# Patient Record
Sex: Female | Born: 2000 | Race: Black or African American | Hispanic: No | Marital: Single | Smoking: Never smoker
Health system: Southern US, Community
[De-identification: ages and names within clinical notes are randomized; demographics above are authoritative.]

## PROBLEM LIST (undated history)

## (undated) HISTORY — PX: TONSILLECTOMY: SUR1361

---

## 2019-01-30 DIAGNOSIS — Z3490 Encounter for supervision of normal pregnancy, unspecified, unspecified trimester: Secondary | ICD-10-CM | POA: Insufficient documentation

## 2019-01-31 ENCOUNTER — Other Ambulatory Visit (HOSPITAL_COMMUNITY)
Admission: RE | Admit: 2019-01-31 | Discharge: 2019-01-31 | Disposition: A | Discharge status: Home / Self Care | Payer: BC Managed Care – PPO | Source: Ambulatory Visit | Attending: Medical | Admitting: Medical

## 2019-01-31 ENCOUNTER — Other Ambulatory Visit: Payer: Self-pay

## 2019-01-31 ENCOUNTER — Ambulatory Visit (INDEPENDENT_AMBULATORY_CARE_PROVIDER_SITE_OTHER): Payer: BC Managed Care – PPO | Admitting: Medical

## 2019-01-31 ENCOUNTER — Other Ambulatory Visit: Payer: Self-pay | Admitting: Medical

## 2019-01-31 ENCOUNTER — Encounter: Payer: Self-pay | Admitting: Medical

## 2019-01-31 DIAGNOSIS — Z3481 Encounter for supervision of other normal pregnancy, first trimester: Secondary | ICD-10-CM | POA: Diagnosis not present

## 2019-01-31 DIAGNOSIS — Z113 Encounter for screening for infections with a predominantly sexual mode of transmission: Secondary | ICD-10-CM

## 2019-01-31 DIAGNOSIS — Z3482 Encounter for supervision of other normal pregnancy, second trimester: Secondary | ICD-10-CM | POA: Diagnosis not present

## 2019-01-31 DIAGNOSIS — Z3490 Encounter for supervision of normal pregnancy, unspecified, unspecified trimester: Secondary | ICD-10-CM | POA: Insufficient documentation

## 2019-01-31 MED ORDER — BLOOD PRESSURE KIT DEVI: 1.0000 | 0 refills | Status: DC

## 2019-01-31 NOTE — Progress Notes (Signed)
   PRENATAL VISIT NOTE  Subjective:  Danielle Levine is a 18 y.o. G1P0 at 76w4dbeing seen today for her first prenatal visit for this pregnancy. This pregnancy was unplanned. She is excited. She is currently monitored for the following issues for this low-risk pregnancy and has Supervision of normal pregnancy, antepartum on their problem list.  Patient reports no complaints.  Contractions: Not present. Vag. Bleeding: None.   . Denies leaking of fluid.   She is planning to breastfeed. Desires Depo for contraception.   The following portions of the patient's history were reviewed and updated as appropriate: allergies, current medications, past family history, past medical history, past social history, past surgical history and problem list.   Objective:   Vitals:   01/31/19 1033 01/31/19 1038  BP: 100/66   Pulse: 96   Temp: 98.1 F (36.7 C)   Weight: 115 lb (52.2 kg)   Height:  '5\' 3"'$  (1.6 m)    Fetal Status: Fetal Heart Rate (bpm): 154         General:  Alert, oriented and cooperative. Patient is in no acute distress.  Skin: Skin is warm and dry. No rash noted.   Cardiovascular: Normal heart rate and rhythm noted  Respiratory: Normal respiratory effort, no problems with respiration noted. Clear to auscultation.   Abdomen: Soft, gravid, appropriate for gestational age. Normal bowel sounds. Non-tender. Pain/Pressure: Absent     Pelvic: Cervical exam deferred        Extremities: Normal range of motion.  Edema: None  Mental Status: Normal mood and affect. Normal behavior. Normal judgment and thought content.   Assessment and Plan:  Pregnancy: G1P0 at 176w4d. Encounter for supervision of normal pregnancy, antepartum, unspecified gravidity - Blood Pressure Monitoring (BLOOD PRESSURE KIT) DEVI; 1 kit by Does not apply route once a week. Check BP regularly and record readings into the Bab[employer] App.  Large Cuff.  DX O90.0  Dispense: 1 each; Refill: 0 - Enroll Patient in [employer] -  Babyscripts Schedule Optimization - AFP, Serum, Open Spina Bifida - Obstetric Panel, Including HIV - Culture, OB Urine - Genetic Screening - [place] OB COMP + 14 WK; Future - Cervicovaginal ancillary only( [facility])  Preterm labor/ second trimester symptoms and general obstetric precautions including but not limited to vaginal bleeding, contractions, leaking of fluid and fetal movement were reviewed in detail with the patient. Please refer to After Visit Summary for other counseling recommendations.   Return in about 4 weeks (around 02/28/2019) for LOB, Virtual.  No future appointments.  JuKerry HoughPA-C

## 2019-01-31 NOTE — Addendum Note (Signed)
Addended by: Tamela Oddi on: 01/31/2019 11:40 AM   Modules accepted: Orders

## 2019-01-31 NOTE — Patient Instructions (Addendum)
Second Trimester of Pregnancy  The second trimester is from week 14 through week 27 (month 4 through 6). This is often the time in pregnancy that you feel your best. Often times, morning sickness has lessened or quit. You may have more energy, and you may get hungry more often. Your unborn baby is growing rapidly. At the end of the sixth month, he or she is about 9 inches long and weighs about 1 pounds. You will likely feel the baby move between 18 and 20 weeks of pregnancy. Follow these instructions at home: Medicines  Take over-the-counter and prescription medicines only as told by your doctor. Some medicines are safe and some medicines are not safe during pregnancy.  Take a prenatal vitamin that contains at least 600 micrograms (mcg) of folic acid.  If you have trouble pooping (constipation), take medicine that will make your stool soft (stool softener) if your doctor approves. Eating and drinking   Eat regular, healthy meals.  Avoid raw meat and uncooked cheese.  If you get low calcium from the food you eat, talk to your doctor about taking a daily calcium supplement.  Avoid foods that are high in fat and sugars, such as fried and sweet foods.  If you feel sick to your stomach (nauseous) or throw up (vomit): ? Eat 4 or 5 small meals a day instead of 3 large meals. ? Try eating a few soda crackers. ? Drink liquids between meals instead of during meals.  To prevent constipation: ? Eat foods that are high in fiber, like fresh fruits and vegetables, whole grains, and beans. ? Drink enough fluids to keep your pee (urine) clear or pale yellow. Activity  Exercise only as told by your doctor. Stop exercising if you start to have cramps.  Do not exercise if it is too hot, too humid, or if you are in a place of great height (high altitude).  Avoid heavy lifting.  Wear low-heeled shoes. Sit and stand up straight.  You can continue to have sex unless your doctor tells you not  to. Relieving pain and discomfort  Wear a good support bra if your breasts are tender.  Take warm water baths (sitz baths) to soothe pain or discomfort caused by hemorrhoids. Use hemorrhoid cream if your doctor approves.  Rest with your legs raised if you have leg cramps or low back pain.  If you develop puffy, bulging veins (varicose veins) in your legs: ? Wear support hose or compression stockings as told by your doctor. ? Raise (elevate) your feet for 15 minutes, 3-4 times a day. ? Limit salt in your food. Prenatal care  Write down your questions. Take them to your prenatal visits.  Keep all your prenatal visits as told by your doctor. This is important. Safety  Wear your seat belt when driving.  Make a list of emergency phone numbers, including numbers for family, friends, the hospital, and police and fire departments. General instructions  Ask your doctor about the right foods to eat or for help finding a counselor, if you need these services.  Ask your doctor about local prenatal classes. Begin classes before month 6 of your pregnancy.  Do not use hot tubs, steam rooms, or saunas.  Do not douche or use tampons or scented sanitary pads.  Do not cross your legs for long periods of time.  Visit your dentist if you have not done so. Use a soft toothbrush to brush your teeth. Floss gently.  Avoid all smoking, herbs,   and alcohol. Avoid drugs that are not approved by your doctor.  Do not use any products that contain nicotine or tobacco, such as cigarettes and e-cigarettes. If you need help quitting, ask your doctor.  Avoid cat litter boxes and soil used by cats. These carry germs that can cause birth defects in the baby and can cause a loss of your baby (miscarriage) or stillbirth. Contact a doctor if:  You have mild cramps or pressure in your lower belly.  You have pain when you pee (urinate).  You have bad smelling fluid coming from your vagina.  You continue to  feel sick to your stomach (nauseous), throw up (vomit), or have watery poop (diarrhea).  You have a nagging pain in your belly area.  You feel dizzy. Get help right away if:  You have a fever.  You are leaking fluid from your vagina.  You have spotting or bleeding from your vagina.  You have severe belly cramping or pain.  You lose or gain weight rapidly.  You have trouble catching your breath and have chest pain.  You notice sudden or extreme puffiness (swelling) of your face, hands, ankles, feet, or legs.  You have not felt the baby move in over an hour.  You have severe headaches that do not go away when you take medicine.  You have trouble seeing. Summary  The second trimester is from week 14 through week 27 (months 4 through 6). This is often the time in pregnancy that you feel your best.  To take care of yourself and your unborn baby, you will need to eat healthy meals, take medicines only if your doctor tells you to do so, and do activities that are safe for you and your baby.  Call your doctor if you get sick or if you notice anything unusual about your pregnancy. Also, call your doctor if you need help with the right food to eat, or if you want to know what activities are safe for you. This information is not intended to replace advice given to you by your health care provider. Make sure you discuss any questions you have with your health care provider. Document Released: 05/03/2009 Document Revised: 05/31/2018 Document Reviewed: 03/14/2016 Elsevier Patient Education  [place] Medications in Pregnancy   Acne:  Benzoyl Peroxide  Salicylic Acid   Backache/Headache:  Tylenol: 2 regular strength every 4 hours OR        2 Extra strength every 6 hours   Colds/Coughs/Allergies:  Benadryl (alcohol free) 25 mg every 6 hours as needed  Breath right strips  Claritin  Cepacol throat lozenges  Chloraseptic throat spray  Cold-Eeze- up to three  times per day  Cough drops, alcohol free  Flonase (by prescription only)  Guaifenesin  Mucinex  Robitussin DM (plain only, alcohol free)  Saline nasal spray/drops  Sudafed (pseudoephedrine) & Actifed * use only after [redacted] weeks gestation and if you do not have high blood pressure  Tylenol  Vicks Vaporub  Zinc lozenges  Zyrtec   Constipation:  Colace  Ducolax suppositories  Fleet enema  Glycerin suppositories  Metamucil  Milk of magnesia  Miralax  Senokot  Smooth move tea   Diarrhea:  Kaopectate  Imodium A-D   *NO pepto Bismol   Hemorrhoids:  Anusol  Anusol HC  Preparation H  Tucks   Indigestion:  Tums  Maalox  Mylanta  Zantac  Pepcid   Insomnia:  Benadryl (alcohol free) '25mg'$  every 6 hours as needed  Tylenol PM  Unisom, no Gelcaps   Leg Cramps:  Tums  MagGel   Nausea/Vomiting:  Bonine  Dramamine  Emetrol  Ginger extract  Sea bands  Meclizine  Nausea medication to take during pregnancy:  Unisom (doxylamine succinate 25 mg tablets) Take one tablet daily at bedtime. If symptoms are not adequately controlled, the dose can be increased to a maximum recommended dose of two tablets daily (1/2 tablet in the morning, 1/2 tablet mid-afternoon and one at bedtime).  Vitamin B6 '100mg'$  tablets. Take one tablet twice a day (up to 200 mg per day).   Skin Rashes:  Aveeno products  Benadryl cream or '25mg'$  every 6 hours as needed  Calamine Lotion  1% cortisone cream   Yeast infection:  Gyne-lotrimin 7  Monistat 7    **If taking multiple medications, please check labels to avoid duplicating the same active ingredients  **take medication as directed on the label  ** Do not exceed 4000 mg of tylenol in 24 hours  **Do not take medications that contain aspirin or ibuprofen         AREA PEDIATRIC/FAMILY PRACTICE PHYSICIANS  Central/[place]outheast [place] [phone]) . [facility] Davy Pique, MD; Gwendlyn Deutscher, MD; Walker Kehr, MD; Andria Frames, MD; McDiarmid,  MD; Dutch Quint, MD; Nori Riis, MD; Mingo Amber, [address] [place], [place] [address] o [phone] o Mon-Fri 8:30-12:30, 1:30-5:00 o Providers come to see babies at [facility] o Accepting Medicaid . [place] [facility] [place] providers who accept newborns: Dorthy Cooler, MD; Orland Mustard, MD; Stephanie Acre, MD o [place], [place], [place] [address] o [phone] o Mon-Fri 8:00-5:30 o Babies seen by providers at [facility] o Does NOT accept Medicaid o Please call early in hospitalization for appointment (limited availability)  . Mustard [place], MD o [address] [place], [place] [address] o [phone] o Mon, Tue, Thur, Fri 8:30-5:00, Wed 10:00-7:00 (closed 1-2pm) o Babies seen by [facility] providers o Accepting Medicaid . N[place], MD o [place], [place], [place] [address] o [phone] o Mon-Fri 8:30-5:00, Sat 8:30-12:00 o Provider comes to see babies at [place] Medicaid o Must have been referred from current patients or contacted office prior to delivery . [facility] ([place] for [place]) Franne Forts, MD; Tamera Punt, MD; Doneen Poisson, MD; Fatima Sanger, MD; Wynetta Emery, MD; Jess Barters, MD; Tami Ribas, MD; Herbert Moors, MD; Derrell Lolling, MD; Dorothyann Peng, MD; Lucious Groves, NP; Baldo Ash, NP o [address], [place], [place] [address] o [phone] o Mon, Tue, Thur, Fri 8:30-5:30, Wed 9:30-5:30, Sat 8:30-12:30 o Babies seen by [facility] providers o Accepting Medicaid o Only accepting infants of first-time parents or siblings of current patients [facility] discharge coordinator will make follow-up appointment . Baltazar Najjar o [place] [address], [place], [place]  [address] o [phone]   Fax - [phone] . [facility] o [address], [place], [place]  [address] o Phone - [phone]   Fax [phone] . [place], [place], [place], [place]  [address] o [phone]  East[place]ortheast [place] [phone]) . [facility] Reginal Lutes, MD; Jacklynn Ganong, MD; Torrie Mayers, MD; MD; Rosana Hoes, MD; Servando Salina, MD; Rose Fillers, MD; Rex Kras, MD; Corinna Capra, MD; Volney American, MD; Trilby Drummer, MD; Janann Colonel, MD; Jimmye Norman, [place], [place], [place] [address] o [phone] o Mon-Fri 8:30-5:00 (extended evenings Mon-Thur as needed), Sat-Sun 10:00-1:00 o Providers  come to see babies at [place] Medicaid for families of first-time babies and families with all children in the household age 48 and under. Must register with office prior to making appointment (M-F only). . [place], NP; Tomi Bamberger, MD; Redmond School, MD; [place], [address] [place], [place] [address] o [phone] o Mon-Fri 8:00-5:00 o Babies seen by providers at [facility] o Does NOT accept Medicaid/Commercial Insurance Only . [facility] [place] [facility])  Marnee Guarneri, MD; Drema Dallas, MD; Montine Circle, MD; Vilma Prader, MD; Vanita Panda, MD; Alfonso Ramus, MD; Ruthann Cancer, MD; Roxanne Mins, MD; Rosalva Ferron, MD; Polly Cobia, MD o [address] [place], [place] [address] o [phone] o Mon-Fri 8:30-5:30, Sat (Oct.-Mar.) 9:00-1:00 o Babies seen by providers at [place] [phone]) . [facility] Peers, MD; Suzan Slick, MD o [place] [address], [place], [place] [address] o [phone] o Mon-Fri 8:30-5:00, Sat 8:30-12:00 o Providers come to see babies at [facility] o Does NOT accept Medicaid . [place] at [place], [place]; [place], MD; [place], [place]; Nancy Fetter, MD; Moreen Fowler, [place], [place], [place] [address] o [phone] o Mon-Fri 8:00-5:00 o Babies seen by providers at [facility] o Does NOT accept Medicaid o Only accepting babies of parents who are  patients o Please call early in hospitalization for appointment (limited availability) . [facility] Blanca Friend, MD; Sharlene Motts, MD; Rod Can, MD; Warner Mccreedy, NP; Sabra Heck, MD; Ermalinda Memos, MD; Sharlett Iles, NP; Aurther Loft, MD; Jerrye Beavers, MD; Marcello Moores, MD; Berline Lopes, MD; Charolette Forward, MD o [place]. [place], [place], [place] [address] o [phone] o Mon-Fri 8:00-5:00, Sat 9:00-12:00 o Providers come to see babies at [facility] o Does NOT accept [facility] [phone]) . [place] providers accepting new patients: Dayna Ramus, NP; [place], [place], [place], [place] [address] o [phone] o Mon-Fri 8:00-5:00 o Babies seen by providers at [facility] o Does NOT accept Medicaid o Only accepting babies of parents who are patients o Please call early in hospitalization for appointment (limited availability) . [facility] Oswaldo Conroy, MD; Sheran Lawless, MD o [address] [place], [place] [address] o [phone] (press 1 to schedule appointment) o Mon-Fri 8:00-5:00 o Providers come to see babies at [facility] o Does NOT accept Medicaid . [facility] Jodi Mourning, MD o [address] [place], [place] [address] o [phone] o Mon-Fri 8:30-5:00 (lunch 12:30-1:00), extended hours by appointment only Wed 5:00-6:30 o Babies seen by [facility] providers o Accepting Medicaid . [place] at Evalyn Casco, MD; [place], MD; Ethlyn Gallery, MD o [place], [place], [place] [address] o [phone] o Mon-Fri 8:00-5:00 o Babies seen by [facility] providers o Does NOT accept Medicaid . Therapist, music at [place], MD; Yong Channel, MD; [place], [address] [place], [place] [address] o [phone] o Mon-Fri 8:00-5:00 o Babies seen by [facility] providers o Does NOT accept Medicaid . [place], [place]; [place], [place]; [place], NP; Albertina Parr, MD;  Frederic Jericho, MD; Ronney Lion, MD; Carlos Levering, NP; Jerelene Redden, NP; Tomasita Crumble, NP; Ronelle Nigh, NP; Corinna Lines, MD; [place], MD o [place]., [place], [place] [address] o [phone] o Mon-Fri 8:30-5:00, Sat 10:00-1:00 o Providers come to see babies at [facility] o Does NOT accept Medicaid o Free prenatal information session Tuesdays at 4:45pm . [place]  Porfirio Oar, MD; Araceli Bouche, [place]; [place], [place]; [place], [place]., [place] [address] o [phone] o Mon-Fri 7:30-5:30 o Babies seen by [facility] providers . [facility] o [address], [place], [place], [place]  [address] o [phone]   Fax - [phone]  [place] [phone] & [phone]) . [place], MD o [address] [place], [place] [address] o [phone] o Mon-Thur 8:00-6:00 o Providers come to see babies at [place] Medicaid . [place], NP; Melford Aase, MD; [place], [place]; [place], [place]., [place], [place] [address] o [phone] o Mon-Thur 7:30-7:30, Fri 7:30-4:30 o Babies seen by [facility] providers o Accepting Medicaid . [facility] Nyra Jabs, MD; Cristino Martes, NP; Gertie Baron, MD o [place] [place] [address], [place], [place] [address] o [phone] o Mon-Fri 8:30-5:00, Sat 8:30-12:00 o Providers come to see babies at [place] Medicaid o Must have "Meet & Greet" appointment at office prior to delivery . [place] ([place]) Jodene Nam, MD; Juleen China, MD; Clydene Laming, [place], [place], [place] [address] o [phone] o Mon-Wed 8:00-6:00, Thur-Fri 8:00-5:00, Sat 9:00-12:00 o Providers come to see babies at [facility] o Does NOT accept Medicaid o Only accepting siblings of current patients . [facility] [place], [place],  [place], [place]  [address] o [phone]   Fax [phone] . [place] at [place] [address], [place], [place]  [address] o [phone]   Fax - [place] [phone] & [phone]) . Therapist, music at [place], DO; [place], [address] [place], [place] [address] o [phone] o Mon-Fri 7:00-5:00 o Babies seen by [facility] providers o Does NOT accept Medicaid . [place], MD; [place], [place]; [place], [place], [place], [place] [address] o [phone] o Mon-Fri 8:00-5:00 o Babies seen by [facility] providers o Accepting Medicaid . [place], MD; [place], [place]; [place], NP; [place], [place], [place], [place] [address] o [phone] o Mon-Fri 8:00-5:00 o Babies seen by providers at [place] [phone]) . [facility] [place], [place] o [address]., [place], [place] [address] o [phone] o Mon-Fri 8:00-5:00 o Babies seen by [facility] providers o Does NOT accept Medicaid o Limited availability, please call early in hospitalization to schedule follow-up . [facility] Leilani Merl, PA; Maisie Fus, MD; [place], MD; [place], [place]; Jeannine Kitten, MD; [place], [place] [facility] [address], [place], [place] [address] o [phone] o Mon-Fri 8:30-5:00, Sat 9:00-12:00 o Babies seen by providers at [facility] o Accepting Medicaid o Please register online then schedule online or call office o [email or web address] . [place] ([place] at [place]) Kristian Covey, NP; Dwyane Dee, MD; Leonidas Romberg, PA o [address] [place], [place], [place] [address] o [phone] o Mon-Fri 8:00-5:00 o Babies seen by providers at [facility] o Accepting Medicaid . [place] ([facility] [employer]) Dairl Ponder, MD; Rayvon Char, NP; Melina Modena, MD o [address] [place], [place],  [address] o [phone] o Mon-Fri 8:00-5:30, Sat&Sun by appointment (phones  open at 8:30) o Babies seen by [facility] providers o Accepting Medicaid o Must be a first-time baby or sibling of current patient . [place], [address], [place], [place]  [address] o [phone]   Fax - [phone]  [place] [phone] & [phone]) . [place], [place]; [place], [place]; Benjamine Mola, MD; [place], [place]; Harrell Lark, MD o [address], [place], [place] [address] o [phone] o Mon-Thur 8:00-7:00, Fri 8:00-5:00, Sat 8:00-12:00, Sun 9:00-12:00 o Babies seen by [facility] providers o Accepting Medicaid . [facility] at [facility], MD; Ruthann Cancer, MD; [facility], MD o [address], [place], [place] [address] o [phone] o Mon-Thur 8:00-5:00 o Babies seen by providers at [facility] o Accepting Medicaid . [facility] at [place], MD; Coe[place]Go[place], MD; Amedeo Plenty, MD; Bobby Rumpf, MD; List, MD; [place] Drafts, MD; Ruthann Cancer, MD; Selinda Eon, MD; Audie Box, MD; Jim Like, MD; Christie Nottingham, MD; Hubbard Hartshorn, MD; Modena Nunnery, MD o [place]., [place], [place] [address] o [phone] o Mon-Fri 8:00-5:30, Sat (Oct.-Mar.) 9:00-1:00 o Babies seen by providers at [facility] o Accepting Medicaid o Must fill out new patient packet, available online at [email or web address] . [place] ([facility]) Barnabas Lister, NP; [place]on Kingfisher, NP; Claiborne Billings, NP; Rolla Plate, MD; [place], [place]; Carola Rhine, MD; Tyron Russell, MD; Delia Chimes, NP o [address], [place], [place] [address] o [phone] o Mon-Thur 8:00-5:30, Fri 8:00-5:00 o Babies seen by providers  at [place] [phone]) . [place], [place]; [place], MD; Dennard Schaumann, MD; [place], [place] o [address] [place], [place] [address] o [phone] o Mon-Fri 8:00-5:00 o Babies seen by providers at [place] [phone]) . [place] at [place], [place]; Olen Pel, MD; [place], [place], [place], [place] [address] o [phone] o Mon-Fri 8:00-5:00 o Babies seen by providers at [facility] o Does NOT accept Medicaid o Limited appointment availability, please call early in hospitalization  . Therapist, music at [place], [place]; [place], [address] [address] [place], [place] [address] o [phone] o Mon-Fri 8:00-5:00 o Babies seen by [facility] providers o Does NOT accept Medicaid . [facility] Su Grand, MD; Guy Sandifer, MD; [place], [place]; [place], [place] [address], [place], [place] [address] o [phone] o Mon-Fri 8:00-5:00 o After hours clinic [facility] [place], [place] [address]) [phone] Mon-Fri 5:00-8:00, Sat 12:00-6:00, Sun 10:00-4:00 o Babies seen by [facility] providers o Accepting Medicaid . [place] at [facility] o [address] [place], [place]  [address] o [phone]   Fax - [phone]  [place] [phone]) . Therapist, music at [facility], MD o [address] [place], [place], [place] [address] o [phone] o Mon-Fri 8:00-5:00 o Babies seen by [facility] providers o Does NOT accept Medicaid . [place] ([place] [facility] [place]) Bing Neighbors, MD o [address] [place], [place], [place] [address] o [phone] o Mon-Thur 8:00-7:00, Fri 8:00-5:00, Sat 8:00-12:00 o Babies seen by providers at [facility] o Accepting Medicaid - but does not have vaccinations in office (must be  received elsewhere) o Limited availability, please  call early in hospitalization   ([id number]) . [place], MD o [address] [place] [address] o [phone]  Fax [phone]  Childbirth Education Options: [facility] Department Classes:  Childbirth education classes can help you get ready for a positive parenting experience. You can also meet other expectant parents and get free stuff for your baby. Each class runs for five weeks on the same night and costs $45 for the mother-to-be and her support person. Medicaid covers the cost if you are eligible. Call [phone] to register. [facility] Childbirth Education:  [phone] or [phone] or [email or web address]  Baby & Me Class: Discuss newborn & infant parenting and family adjustment issues with other new mothers in a relaxed environment. Each week brings a new speaker or baby-centered activity. We encourage new mothers to join [place] every Thursday at 11:00am. Babies birth until crawling. No registration or fee. Daddy [employer]: This course offers Dads-to-be the tools and knowledge needed to feel confident on their journey to becoming new fathers. Experienced dads, who have been trained as coaches, teach dads-to-be how to hold, comfort, diaper, swaddle and play with their infant while being able to support the new mom as well. A class for men taught by men. $25/dad Big Brother/Big Sister: Let your children share in the joy of a new brother or sister in this special class designed just for them. Class includes discussion about how families care for babies: swaddling, holding, diapering, safety as well as how they can be helpful in their new role. This class is designed for children ages 50 to 44, but any age is welcome. Please register each child individually. $5/child  Mom Talk: This mom-led group offers support and connection to mothers as they journey through the  adjustments and struggles of that sometimes overwhelming first year after the birth of a child. Tuesdays at 10:00am and Thursdays at 6:00pm. Babies welcome. No registration or fee. Breastfeeding Support Group: This group is a mother-to-mother support circle where moms have the opportunity to share their breastfeeding experiences. A Lactation Consultant is present for questions and concerns. Meets each Tuesday at 11:00am. No fee or registration. Breastfeeding Your Baby: Learn what to expect in the first days of breastfeeding your newborn.  This class will help you feel more confident with the skills needed to begin your breastfeeding experience. Many new mothers are concerned about breastfeeding after leaving the hospital. This class will also address the most common fears and challenges about breastfeeding during the first few weeks, months and beyond. (call for fee) Comfort Techniques and Tour: This 2 hour interactive class will provide you the opportunity to learn & practice hands-on techniques that can help relieve some of the discomfort of labor and encourage your baby to rotate toward the best position for birth. You and your partner will be able to try a variety of labor positions with birth balls and rebozos as well as practice breathing, relaxation, and visualization techniques. A tour of the [facility] is included with this class. $20 per registrant and support person Childbirth Class- Weekend Option: This class is a Weekend version of our Birth & Baby series. It is designed for parents who have a difficult time fitting several weeks of classes into their schedule. It covers the care of your newborn and the basics of labor and childbirth. It also includes a [place] of [facility] and lunch. The class is held two consecutive days: beginning on Friday  evening from 6:30 - 8:30 p.m. and the next day, Saturday from 9 a.m. - 4 p.m. (call for fee) Waterbirth  Class: Interested in a waterbirth?  This informational class will help you discover whether waterbirth is the right fit for you. Education about waterbirth itself, supplies you would need and how to assemble your support team is what you can expect from this class. Some obstetrical practices require this class in order to pursue a waterbirth. (Not all obstetrical practices offer waterbirth-check with your healthcare provider.) Register only the expectant mom, but you are encouraged to bring your partner to class! Required if planning waterbirth, no fee. Infant/Child CPR: Parents, grandparents, babysitters, and friends learn Cardio-Pulmonary Resuscitation skills for infants and children. You will also learn how to treat both conscious and unconscious choking in infants and children. This Family & Friends program does not offer certification. Register each participant individually to ensure that enough mannequins are available. (Call for fee) Grandparent Love: Expecting a grandbaby? This class is for you! Learn about the latest infant care and safety recommendations and ways to support your own child as he or she transitions into the parenting role. Taught by Registered Nurses who are childbirth instructors, but most importantly...they are grandmothers too! $10/person. Childbirth Class- Natural Childbirth: This series of 5 weekly classes is for expectant parents who want to learn and practice natural methods of coping with the process of labor and childbirth. Relaxation, breathing, massage, visualization, role of the partner, and helpful positioning are highlighted. Participants learn how to be confident in their body's ability to give birth. This class will empower and help parents make informed decisions about their own care. Includes discussion that will help new parents transition into the immediate postpartum period. [facility] is included. We suggest taking this class  between 25-32 weeks, but it's only a recommendation. $75 per registrant and one support person or $30 Medicaid. Childbirth Class- 3 week Series: This option of 3 weekly classes helps you and your labor partner prepare for childbirth. Newborn care, labor & birth, cesarean birth, pain management, and comfort techniques are discussed and a [place] [facility] is included. The class meets at the same time, on the same day of the week for 3 consecutive weeks beginning with the starting date you choose. $60 for registrant and one support person.  Marvelous Multiples: Expecting twins, triplets, or more? This class covers the differences in labor, birth, parenting, and breastfeeding issues that face multiples' parents. NICU tour is included. Led by a Certified Childbirth Educator who is the mother of twins. No fee. Caring for Baby: This class is for expectant and adoptive parents who want to learn and practice the most up-to-date newborn care for their babies. Focus is on birth through the first six weeks of life. Topics include feeding, bathing, diapering, crying, umbilical cord care, circumcision care and safe sleep. Parents learn to recognize symptoms of illness and when to call the pediatrician. Register only the mom-to-be and your partner or support person can plan to come with you! $10 per registrant and support person Childbirth Class- online option: This online class offers you the freedom to complete a Birth and Baby series in the comfort of your own home. The flexibility of this option allows you to review sections at your own pace, at times convenient to you and your support people. It includes additional video information, animations, quizzes, and extended activities. Get organized with helpful eClass tools, checklists, and  trackers. Once you register online for the class, you will receive an email within a few days to accept the invitation and begin the class when the time is  right for you. The content will be available to you for 60 days. $60 for 60 days of online access for you and your support people.  Local Doulas: Natural Baby Doulas [email or web address] Tel: [phone] [email or web address] [employer] [phone] [email or web address] The Labor Hassell Halim  (also do waterbirth tub rental) [phone] [email or web address] [facility] Birth Doula [phone] [email or web address].fi [facility] Rhythms  [phone] [email or web address] [employer] (PADA) [email or web address] [place] Birth [facility] Baby  [email or web address] Considering Waterbirth? Guide for patients at [facility] [employer]  Why consider waterbirth?  . Gentle birth for babies . Less pain medicine used in labor . May allow for passive descent/less pushing . May reduce perineal tears  . More mobility and instinctive maternal position changes . Increased maternal relaxation . Reduced blood pressure in labor  Is waterbirth safe? What are the risks of infection, drowning or other complications?  . Infection: o Very low risk (3.7 % for tub vs 4.8% for bed) o 7 in 8000 waterbirths with documented infection o Poorly cleaned equipment most common cause o Slightly lower group B strep transmission rate  . Drowning o Maternal:  - Very low risk   - Related to seizures or fainting o Newborn:  - Very low risk. No evidence of increased risk of respiratory problems in multiple large studies - Physiological protection from breathing under water - Avoid underwater birth if there are any fetal complications - Once baby's head is out of the water, keep it out.  . Birth complication o Some reports of cord trauma, but risk decreased by bringing baby to surface gradually o No evidence of  increased risk of shoulder dystocia. Mothers can usually change positions faster in water than in a bed, possibly aiding the maneuvers to free the shoulder.   You must attend a Doren Custard class at [facility]  3rd Wednesday of every month from 7-9pm  [employer] by calling [phone] or online at [email or web address]  Bring [place] the certificate from the class to your prenatal appointment  Meet with a midwife at 36 weeks to see if you can still plan a waterbirth and to sign the consent.   Purchase or rent the following supplies:   Water Birth Pool (Birth Pool in a Box or [place] for instance)  (Tubs start ~$125)  Single-use disposable tub liner designed for your brand of tub  New garden hose labeled "lead-free", "suitable for drinking water",  Electric drain pump to remove water (We recommend 792 gallon per hour or greater pump.)   Separate garden hose to remove the dirty water  Fish net  Bathing suit top (optional)  Long-handled mirror (optional)  Places to purchase or rent supplies  GotWebTools.is for tub purchases and supplies  Waterbirthsolutions.com for tub purchases and supplies  The Labor Ladies (www.[email or web address]) $275 for tub rental/set-up & take down/kit   [employer] (http://www.fleming.com/.htm) Information regarding doulas (labor support) who provide pool rentals  Our practice has a Birth Pool in a Box tub at the hospital that you may borrow on a first-come-first-served basis. It is your responsibility to to set up, clean and break down the tub. We cannot guarantee the availability of this tub in advance. You are responsible for bringing all accessories listed above. If you do not have all necessary supplies you cannot have a waterbirth.  Things that would prevent you from having a waterbirth:  Premature, <37wks  Previous cesarean birth  Presence of thick meconium-stained fluid  Multiple gestation (Twins,  triplets, etc.)  Uncontrolled diabetes or gestational diabetes requiring medication  Hypertension requiring medication or diagnosis of pre-eclampsia  Heavy vaginal bleeding  Non-reassuring fetal heart rate  Active infection (MRSA, etc.). Group B Strep is NOT a contraindication for  waterbirth.  If your labor has to be induced and induction method requires continuous  monitoring of the baby's heart rate  Other risks/issues identified by your obstetrical provider  Please remember that birth is unpredictable. Under certain unforeseeable circumstances your provider may advise against giving birth in the tub. These decisions will be made on a case-by-case basis and with the safety of you and your baby as our highest priority.

## 2019-01-31 NOTE — Progress Notes (Signed)
New OB.  Reports no problems today. 

## 2019-02-03 ENCOUNTER — Other Ambulatory Visit: Payer: Medicaid Other

## 2019-02-03 ENCOUNTER — Other Ambulatory Visit: Payer: Self-pay

## 2019-02-03 LAB — CERVICOVAGINAL ANCILLARY ONLY
Chlamydia: NEGATIVE
Comment: NEGATIVE
Comment: NORMAL
Neisseria Gonorrhea: NEGATIVE

## 2019-02-03 LAB — CULTURE, OB URINE

## 2019-02-03 LAB — URINE CULTURE, OB REFLEX

## 2019-02-04 LAB — AFP, SERUM, OPEN SPINA BIFIDA
AFP MoM: 1.47
AFP Value: 67.8 ng/mL
Gest. Age on Collection Date: 16.4 weeks
Maternal Age At EDD: 20.3 yr
OSBR Risk 1 IN: 5931
Test Results:: NEGATIVE
Weight: 115 [lb_av]

## 2019-02-04 LAB — OBSTETRIC PANEL, INCLUDING HIV
Antibody Screen: NEGATIVE
Basophils Absolute: 0 10*3/uL (ref 0.0–0.2)
Basos: 0 %
EOS (ABSOLUTE): 0 10*3/uL (ref 0.0–0.4)
Eos: 0 %
HIV Screen 4th Generation wRfx: NONREACTIVE
Hematocrit: 31.8 % — ABNORMAL LOW (ref 34.0–46.6)
Hemoglobin: 10.5 g/dL — ABNORMAL LOW (ref 11.1–15.9)
Hepatitis B Surface Ag: NEGATIVE
Immature Grans (Abs): 0.1 10*3/uL (ref 0.0–0.1)
Immature Granulocytes: 1 %
Lymphocytes Absolute: 1.6 10*3/uL (ref 0.7–3.1)
Lymphs: 18 %
MCH: 29.6 pg (ref 26.6–33.0)
MCHC: 33 g/dL (ref 31.5–35.7)
MCV: 90 fL (ref 79–97)
Monocytes Absolute: 0.4 10*3/uL (ref 0.1–0.9)
Monocytes: 5 %
Neutrophils Absolute: 6.6 10*3/uL (ref 1.4–7.0)
Neutrophils: 76 %
Platelets: 228 10*3/uL (ref 150–450)
RBC: 3.55 x10E6/uL — ABNORMAL LOW (ref 3.77–5.28)
RDW: 12.6 % (ref 11.7–15.4)
RPR Ser Ql: NONREACTIVE
Rh Factor: POSITIVE
Rubella Antibodies, IGG: 2.01 index (ref >0.99)
WBC: 8.8 10*3/uL (ref 3.4–10.8)

## 2019-02-06 ENCOUNTER — Telehealth: Payer: Self-pay | Admitting: *Deleted

## 2019-02-06 NOTE — Telephone Encounter (Signed)
Pt called to office leaving a unclear message, ?natera.  Attempt to return call. LM on VM for pt to call office.

## 2019-02-10 ENCOUNTER — Encounter: Payer: Self-pay | Admitting: Medical

## 2019-02-17 ENCOUNTER — Encounter: Payer: Self-pay | Admitting: Medical

## 2019-02-17 DIAGNOSIS — D563 Thalassemia minor: Secondary | ICD-10-CM | POA: Insufficient documentation

## 2019-02-19 ENCOUNTER — Ambulatory Visit (HOSPITAL_COMMUNITY)
Admission: RE | Admit: 2019-02-19 | Discharge: 2019-02-19 | Disposition: A | Discharge status: Home / Self Care | Payer: BC Managed Care – PPO | Source: Ambulatory Visit | Attending: Medical | Admitting: Medical

## 2019-02-19 ENCOUNTER — Other Ambulatory Visit: Payer: Self-pay

## 2019-02-19 ENCOUNTER — Other Ambulatory Visit: Payer: Self-pay | Admitting: Medical

## 2019-02-19 DIAGNOSIS — Z363 Encounter for antenatal screening for malformations: Secondary | ICD-10-CM | POA: Diagnosis not present

## 2019-02-19 DIAGNOSIS — Z3490 Encounter for supervision of normal pregnancy, unspecified, unspecified trimester: Secondary | ICD-10-CM | POA: Diagnosis not present

## 2019-02-19 DIAGNOSIS — Z3A19 19 weeks gestation of pregnancy: Secondary | ICD-10-CM | POA: Diagnosis not present

## 2019-02-19 DIAGNOSIS — D563 Thalassemia minor: Secondary | ICD-10-CM | POA: Diagnosis not present

## 2019-02-21 NOTE — L&D Delivery Note (Signed)
OB/GYN Faculty Practice Delivery Note  Danielle Levine is a 19 y.o. G1P0 s/p VD at [redacted]w[redacted]d. She was admitted for SOL.   ROM: 4h 80m with clear fluid GBS Status: Positive/-- (04/27 0343) Maximum Maternal Temperature: 98.59F  Labor Progress: . Initial SVE: 6/90/-2. SROM occurred and patient progressed to 9.5 cm and was at 9.5 cm for a few hours. Pitocin started. She then progressed to complete.   Delivery Date/Time: 5/17 @ 1945 Delivery: Called to room and patient was complete and pushing. Head delivered LOA. No nuchal cord present. Shoulder and body delivered in usual fashion. Infant with spontaneous cry, placed on mother's abdomen, dried and stimulated. Cord clamped x 2 after 1-minute delay, and cut by FOB. Cord blood drawn. Placenta delivered spontaneously with gentle cord traction. Fundus firm with massage and Pitocin. Labia, perineum, vagina, and cervix inspected inspected with bilateral labial lacerations which were repaired with 4-0 Vicryl in a standard fashion.  Baby Weight: pending  Placenta: Sent to L&D Complications: None Lacerations: bilateral labial EBL: 85 mL Analgesia: Epidural   Infant:  APGAR (1 MIN): 9   APGAR (5 MINS): 9   APGAR (10 MINS):     Jerilynn Birkenhead, MD [facility] Family Medicine Fellow, [facility] for [facility], [facility] 07/07/2019, 8:05 PM

## 2019-02-28 ENCOUNTER — Encounter: Payer: Medicaid Other | Admitting: Women's Health

## 2019-02-28 NOTE — Progress Notes (Deleted)
I connected with Shawnie Pons on 02/28/19 at  9:00 AM EST by: *** and verified that I am speaking with the correct person using two identifiers.  Patient is located at *** and provider is located at ***.     The purpose of this virtual visit is to provide medical care while limiting exposure to the novel coronavirus. I discussed the limitations, risks, security and privacy concerns of performing an evaluation and management service by *** and the availability of in person appointments. I also discussed with the patient that there may be a patient responsible charge related to this service. By engaging in this virtual visit, you consent to the provision of healthcare.  Additionally, you authorize for your insurance to be billed for the services provided during this visit.  The patient expressed understanding and agreed to proceed.  The following staff members participated in the virtual visit:  Donia Ast, NP    PRENATAL VISIT NOTE  Subjective:  Danielle Levine is a 19 y.o. G1P0 at [redacted]w[redacted]d  for phone visit for ongoing prenatal care.  She is currently monitored for the following issues for this low-risk pregnancy and has Supervision of normal pregnancy, antepartum and Alpha thalassemia silent carrier on their problem list.  Patient reports {[id number]}.   .  .   . Denies leaking of fluid.   The following portions of the patient's history were reviewed and updated as appropriate: allergies, current medications, past family history, past medical history, past social history, past surgical history and problem list.   Objective:  There were no vitals filed for this visit. Self-Obtained  Fetal Status:           Assessment and Plan:  Pregnancy: G1P0 at [redacted]w[redacted]d  1. Supervision of normal first pregnancy, antepartum *** -doing well  2. Alpha thalassemia silent carrier *** -pt continues to decline genetic counseling -partner has not yet been tested  Preterm labor symptoms and general obstetric  precautions including but not limited to vaginal bleeding, contractions, leaking of fluid and fetal movement were reviewed in detail with the patient.  No follow-ups on file.  Future Appointments  Date Time Provider Department Center  02/28/2019  9:00 AM Naziah Weckerly, Odie Sera, NP [facility] None     Time spent on virtual visit: *** minutes  Marylen Ponto, NP

## 2019-03-04 ENCOUNTER — Telehealth: Payer: Medicaid Other

## 2019-03-04 ENCOUNTER — Telehealth (INDEPENDENT_AMBULATORY_CARE_PROVIDER_SITE_OTHER): Payer: Medicaid Other

## 2019-03-04 DIAGNOSIS — Z3A21 21 weeks gestation of pregnancy: Secondary | ICD-10-CM

## 2019-03-04 DIAGNOSIS — O99012 Anemia complicating pregnancy, second trimester: Secondary | ICD-10-CM

## 2019-03-04 DIAGNOSIS — Z3490 Encounter for supervision of normal pregnancy, unspecified, unspecified trimester: Secondary | ICD-10-CM

## 2019-03-04 MED ORDER — FERROUS SULFATE 325 (65 FE) MG PO TABS: 325.0000 mg | ORAL_TABLET | Freq: Every day | ORAL | 3 refills | Status: AC

## 2019-03-04 NOTE — Patient Instructions (Signed)

## 2019-03-04 NOTE — Progress Notes (Signed)
Virtual OB   CC:

## 2019-03-04 NOTE — Progress Notes (Signed)
TELEHEALTH OBSTETRICS PRENATAL VIRTUAL VIDEO VISIT ENCOUNTER NOTE  Provider location: Center for [facility] [place]   I connected with Danielle Levine on 03/04/19 at  2:30 PM EST by MyChart Video Encounter at home and verified that I am speaking with the correct person using two identifiers.   I discussed the limitations, risks, security and privacy concerns of performing an evaluation and management service virtually and the availability of in person appointments. I also discussed with the patient that there may be a patient responsible charge related to this service. The patient expressed understanding and agreed to proceed. Subjective:  [name] is a 19 y.o. G1P0 at [redacted]w[redacted]d being seen today for ongoing prenatal care.  She is currently monitored for the following issues for this low-risk pregnancy and has Supervision of normal pregnancy, antepartum and Alpha thalassemia silent carrier on their problem list.  Patient reports no complaints.  Contractions: Not present. Vag. Bleeding: None.  Movement: Present. Denies any leaking of fluid.   The following portions of the patient's history were reviewed and updated as appropriate: allergies, current medications, past family history, past medical history, past social history, past surgical history and problem list.   Objective:  There were no vitals filed for this visit.  Fetal Status:     Movement: Present     General:  Alert, oriented and cooperative. Patient is in no acute distress.  Respiratory: Normal respiratory effort, no problems with respiration noted  Mental Status: Normal mood and affect. Normal behavior. Normal judgment and thought content.  Rest of physical exam deferred due to type of encounter  Imaging: [place] MFM OB DETAIL +14 WK  Result Date: 02/19/2019 ----------------------------------------------------------------------  OBSTETRICS REPORT                       (Signed Final 02/19/2019 01:48 pm)  ---------------------------------------------------------------------- Patient Info  ID #:       [id number]                          D.O.B.:  08-Oct-2000 (18 yrs)  Name:       Danielle Levine                  Visit Date: 02/19/2019 01:15 pm ---------------------------------------------------------------------- Performed By  Performed By:     Marcellina Millin          Ref. Address:     [address] [place],                                                             [place] [address]  Attending:        Ma Rings MD         [facility]  Referred By:      Marny LowensteinJULIE N WENZEL                    [place] ---------------------------------------------------------------------- Orders   #  Description                          Code         Ordered By   1  US MFM OB DETAIL +14 WK              76811.01     Vonzella NippleJULIE WENZEL  ----------------------------------------------------------------------   #  Order #                    Accession #                 Episode #   1  [id number]                  [id number]  ---------------------------------------------------------------------- Indications   Teen pregnancy                                 O75.89   Encounter for antenatal screening for          Z36.3   malformations (low risk panorama)   [redacted] weeks gestation of pregnancy                Z3A.19   Genetic carrier (specify)                      Z14.8  ---------------------------------------------------------------------- Fetal Evaluation  Num Of Fetuses:         1  Fetal Heart Rate(bpm):  159  Cardiac Activity:       Observed  Presentation:           Cephalic  Placenta:               Posterior  P. Cord Insertion:      Visualized  Amniotic Fluid  AFI FV:      Within normal limits ----------------------------------------------------------------------  Biometry  BPD:      48.1  mm     G. Age:  20w 4d         92  %    CI:        77.79   %    70 - 86                                                          FL/HC:      17.5   %    16.1 - 18.3  HC:      172.6  mm     G. Age:  19w  6d         69  %    HC/AC:      1.10        1.09 - 1.39  AC:      157.3  mm     G. Age:  20w 6d         90  %    FL/BPD:     62.8   %  FL:       30.2  mm     G. Age:  19w 2d         45  %    FL/AC:      19.2   %    20 - 24  HUM:        30  mm     G. Age:  19w 6d         66  %  CER:      19.3  mm     G. Age:  18w 5d         34  %  Est. FW:     335  gm    0 lb 12 oz      90  % ---------------------------------------------------------------------- OB History  Gravidity:    1         Term:   0        Prem:   0        SAB:   0  TOP:          0       Ectopic:  0        Living: 0 ---------------------------------------------------------------------- Gestational Age  LMP:           19w 2d        Date:  10/07/18                 EDD:   07/14/19  U/S Today:     20w 1d                                        EDD:   07/08/19  Best:          19w 2d     Det. By:  LMP  (10/07/18)          EDD:   07/14/19 ---------------------------------------------------------------------- Anatomy  Cranium:               Appears normal         Aortic Arch:            Appears normal  Cavum:                 Appears normal         Ductal Arch:            Appears normal  Ventricles:            Appears normal         Diaphragm:              Appears normal  Choroid Plexus:        Appears normal         Stomach:                Appears normal, left  sided  Cerebellum:            Appears normal         Abdomen:                Appears normal  Posterior Fossa:       Appears normal         Abdominal Wall:         Appears nml (cord                                                                        insert, abd wall)  Nuchal Fold:           Not applicable (>62     Cord Vessels:           Appears normal ([redacted]                         wks GA)                                        vessel cord)  Face:                  Appears normal         Kidneys:                Appear normal                         (orbits and profile)  Lips:                  Appears normal         Bladder:                Appears normal  Thoracic:              Appears normal         Spine:                  Appears normal  Heart:                 Appears normal         Upper Extremities:      Appears normal                         (4CH, axis, and                         situs)  RVOT:                  Appears normal         Lower Extremities:      Appears normal  LVOT:                  Appears normal  Other:  Fetus appears to be a female. Heels and 5th digit visualized. 3VV and          3VTV visualized. Nasal bone visualized. ---------------------------------------------------------------------- Cervix Uterus Adnexa  Cervix  Length:            2.8  cm.  Normal appearance by transabdominal scan.  Adnexa  No abnormality visualized. ---------------------------------------------------------------------- Comments  This patient was seen for a detailed fetal anatomy scan.  The  patient has screened positive as a silent carrier for alpha  thalassemia.  She denies any significant past medical history and denies  any problems in her current pregnancy.  She had a cell free DNA test earlier in her pregnancy which  indicated a low risk for trisomy 4, 50, and 13. A female fetus is  predicted.  She was informed that the fetal growth and amniotic fluid  level were appropriate for her gestational age.  There were no obvious fetal anomalies noted on today's  ultrasound exam.  The patient was informed that anomalies may be missed due  to technical limitations. If the fetus is in a suboptimal position  or maternal habitus is increased, visualization of the fetus in  the maternal uterus may be impaired.  The implications of being a silent  carrier for alpha  thalassemia was discussed with the patient today.  She  reports that the FOB will be screened soon to determine if he  is also a carrier for alpha thalassemia.  The patient was  offered and declined a meeting with our genetic counselor  today to discuss the significance of being a carrier for alpha  thalassemia.  Follow-up as indicated. ----------------------------------------------------------------------                   Ma Rings, MD Electronically Signed Final Report   02/19/2019 01:48 pm ----------------------------------------------------------------------   Assessment and Plan:  Pregnancy: G1P0 at [redacted]w[redacted]d 1. Encounter for supervision of normal pregnancy, antepartum, unspecified gravidity -Anticipatory guidance for upcoming appts. -Reviewed initial PN lab panel -Discussed alpha thalassemia diagnosis and recommendation for partner testing. -Per records, patient refused genetic testing. -Encouraged to refer to [facility] for further questions and concerns regarding diagnosis. -Next appt in 3 weeks via virtual visit.     2. Anemia during pregnancy in second trimester -Initial HgB at 10.5 -Started on iron supplementation per protocol. -Informed of initiation of iron pill daily -Reviewed risks of iron consumption including constipation. -Encouraged to increase water and fiber in diet. -Rx sent for Ferrous sulfate 325 daily to pharmacy on file.   Preterm labor symptoms and general obstetric precautions including but not limited to vaginal bleeding, contractions, leaking of fluid and fetal movement were reviewed in detail with the patient. I discussed the assessment and treatment plan with the patient. The patient was provided an opportunity to ask questions and all were answered. The patient agreed with the plan and demonstrated an understanding of the instructions. The patient was advised to call back or seek an in-person office evaluation/go to [facility] at [facility] for any urgent or concerning symptoms. Please refer to After Visit Summary for other counseling recommendations.   I provided 8 minutes of face-to-face time during this encounter.  No follow-ups on file.  Future Appointments  Date Time Provider Department Center  03/04/2019  2:30 PM Gerrit Heck, CNM [facility] None    Cherre Robins, CNM [facility] for [employer], [facility]

## 2019-03-04 NOTE — Progress Notes (Signed)
Pt presents for mychart visit. Pt identified with 2 patient identifiers. Pt has no concerns.

## 2019-03-25 ENCOUNTER — Encounter: Payer: Self-pay | Admitting: Obstetrics

## 2019-03-25 ENCOUNTER — Telehealth (INDEPENDENT_AMBULATORY_CARE_PROVIDER_SITE_OTHER): Payer: Medicaid Other | Admitting: Obstetrics

## 2019-03-25 DIAGNOSIS — Z3A24 24 weeks gestation of pregnancy: Secondary | ICD-10-CM

## 2019-03-25 DIAGNOSIS — Z3400 Encounter for supervision of normal first pregnancy, unspecified trimester: Secondary | ICD-10-CM

## 2019-03-25 DIAGNOSIS — Z3402 Encounter for supervision of normal first pregnancy, second trimester: Secondary | ICD-10-CM

## 2019-03-25 MED ORDER — BLOOD PRESSURE KIT DEVI: 1.0000 | 0 refills | Status: AC | PRN

## 2019-03-25 NOTE — Progress Notes (Signed)
   TELEHEALTH OBSTETRICS PRENATAL VIRTUAL VIDEO VISIT ENCOUNTER NOTE  Provider location: Center for [facility] at [place]   I connected with Danielle Levine on 03/25/19 at  2:30 PM EST by [facility] MyChart Video Encounter at home and verified that I am speaking with the correct person using two identifiers.   I discussed the limitations, risks, security and privacy concerns of performing an evaluation and management service virtually and the availability of in person appointments. I also discussed with the patient that there may be a patient responsible charge related to this service. The patient expressed understanding and agreed to proceed. Subjective:  Danielle Levine is a 19 y.o. G1P0 at 55w1dbeing seen today for ongoing prenatal care.  She is currently monitored for the following issues for this low-risk pregnancy and has Supervision of normal pregnancy, antepartum and Alpha thalassemia silent carrier on their problem list.  Patient reports heartburn.  Contractions: Not present. Vag. Bleeding: None.  Movement: Present. Denies any leaking of fluid.   The following portions of the patient's history were reviewed and updated as appropriate: allergies, current medications, past family history, past medical history, past social history, past surgical history and problem list.   Objective:  There were no vitals filed for this visit.  Fetal Status:     Movement: Present     General:  Alert, oriented and cooperative. Patient is in no acute distress.  Respiratory: Normal respiratory effort, no problems with respiration noted  Mental Status: Normal mood and affect. Normal behavior. Normal judgment and thought content.  Rest of physical exam deferred due to type of encounter  Imaging: No results found.  Assessment and Plan:  Pregnancy: G1P0 at 275w1d. Supervision of normal first pregnancy, antepartum Rx: - Blood Pressure Monitoring (BLOOD PRESSURE KIT) DEVI; 1 Device by Does not apply route  as needed.  Dispense: 1 each; Refill: 0  Preterm labor symptoms and general obstetric precautions including but not limited to vaginal bleeding, contractions, leaking of fluid and fetal movement were reviewed in detail with the patient. I discussed the assessment and treatment plan with the patient. The patient was provided an opportunity to ask questions and all were answered. The patient agreed with the plan and demonstrated an understanding of the instructions. The patient was advised to call back or seek an in-person office evaluation[facility] any [facility] or concerning [facility]. Please refer to After Visit Summary for other counseling recommendations.   I provided 10 minutes of face-to-face time during this encounter.  Return in about 4 weeks (around 04/22/2019) for ROB, 2 hour OGTT.  Future Appointments  Date Time Provider De[place]ell2/03/2019  2:30 PM HaShelly BombardMD [facility]    ChBaltazar NajjarMDKennedyor [facility] [place] 03/25/2019

## 2019-03-25 NOTE — Progress Notes (Signed)
I connected with Danielle Levine on 03/25/19 at  2:30 PM EST by telephone and verified that I am speaking with the correct person using two identifiers.  Needs BP cuff  No other concerns per pt

## 2019-04-22 ENCOUNTER — Other Ambulatory Visit: Payer: BC Managed Care – PPO

## 2019-04-22 ENCOUNTER — Ambulatory Visit (INDEPENDENT_AMBULATORY_CARE_PROVIDER_SITE_OTHER): Payer: Medicaid Other | Admitting: Advanced Practice Midwife

## 2019-04-22 ENCOUNTER — Other Ambulatory Visit: Payer: Self-pay

## 2019-04-22 VITALS — BP 105/66 | HR 99 | Wt 125.0 lb

## 2019-04-22 DIAGNOSIS — Z3403 Encounter for supervision of normal first pregnancy, third trimester: Secondary | ICD-10-CM

## 2019-04-22 DIAGNOSIS — Z3A28 28 weeks gestation of pregnancy: Secondary | ICD-10-CM

## 2019-04-22 NOTE — Patient Instructions (Signed)
Third Trimester of Pregnancy The third trimester is from week 28 through week 40 (months 7 through 9). The third trimester is a time when the unborn baby (fetus) is growing rapidly. At the end of the ninth month, the fetus is about 20 inches in length and weighs 6-10 pounds. Body changes during your third trimester Your body will continue to go through many changes during pregnancy. The changes vary from woman to woman. During the third trimester:  Your weight will continue to increase. You can expect to gain 25-35 pounds (11-16 kg) by the end of the pregnancy.  You may begin to get stretch marks on your hips, abdomen, and breasts.  You may urinate more often because the fetus is moving lower into your pelvis and pressing on your bladder.  You may develop or continue to have heartburn. This is caused by increased hormones that slow down muscles in the digestive tract.  You may develop or continue to have constipation because increased hormones slow digestion and cause the muscles that push waste through your intestines to relax.  You may develop hemorrhoids. These are swollen veins (varicose veins) in the rectum that can itch or be painful.  You may develop swollen, bulging veins (varicose veins) in your legs.  You may have increased body aches in the pelvis, back, or thighs. This is due to weight gain and increased hormones that are relaxing your joints.  You may have changes in your hair. These can include thickening of your hair, rapid growth, and changes in texture. Some women also have hair loss during or after pregnancy, or hair that feels dry or thin. Your hair will most likely return to normal after your baby is born.  Your breasts will continue to grow and they will continue to become tender. A yellow fluid (colostrum) may leak from your breasts. This is the first milk you are producing for your baby.  Your belly button may stick out.  You may notice more swelling in your hands,  face, or ankles.  You may have increased tingling or numbness in your hands, arms, and legs. The skin on your belly may also feel numb.  You may feel short of breath because of your expanding uterus.  You may have more problems sleeping. This can be caused by the size of your belly, increased need to urinate, and an increase in your body's metabolism.  You may notice the fetus "dropping," or moving lower in your abdomen (lightening).  You may have increased vaginal discharge.  You may notice your joints feel loose and you may have pain around your pelvic bone. What to expect at prenatal visits You will have prenatal exams every 2 weeks until week 36. Then you will have weekly prenatal exams. During a routine prenatal visit:  You will be weighed to make sure you and the baby are growing normally.  Your blood pressure will be taken.  Your abdomen will be measured to track your baby's growth.  The fetal heartbeat will be listened to.  Any test results from the previous visit will be discussed.  You may have a cervical check near your due date to see if your cervix has softened or thinned (effaced).  You will be tested for Group B streptococcus. This happens between 35 and 37 weeks. Your health care provider may ask you:  What your birth plan is.  How you are feeling.  If you are feeling the baby move.  If you have had any abnormal   symptoms, such as leaking fluid, bleeding, severe headaches, or abdominal cramping.  If you are using any tobacco products, including cigarettes, chewing tobacco, and electronic cigarettes.  If you have any questions. Other tests or screenings that may be performed during your third trimester include:  Blood tests that check for low iron levels (anemia).  Fetal testing to check the health, activity level, and growth of the fetus. Testing is done if you have certain medical conditions or if there are problems during the pregnancy.  Nonstress test  (NST). This test checks the health of your baby to make sure there are no signs of problems, such as the baby not getting enough oxygen. During this test, a belt is placed around your belly. The baby is made to move, and its heart rate is monitored during movement. What is false labor? False labor is a condition in which you feel small, irregular tightenings of the muscles in the womb (contractions) that usually go away with rest, changing position, or drinking water. These are called Braxton Hicks contractions. Contractions may last for hours, days, or even weeks before true labor sets in. If contractions come at regular intervals, become more frequent, increase in intensity, or become painful, you should see your health care provider. What are the signs of labor?  Abdominal cramps.  Regular contractions that start at 10 minutes apart and become stronger and more frequent with time.  Contractions that start on the top of the uterus and spread down to the lower abdomen and back.  Increased pelvic pressure and dull back pain.  A watery or bloody mucus discharge that comes from the vagina.  Leaking of amniotic fluid. This is also known as your "water breaking." It could be a slow trickle or a gush. Let your health care provider know if it has a color or strange odor. If you have any of these signs, call your health care provider right away, even if it is before your due date. Follow these instructions at home: Medicines  Follow your health care provider's instructions regarding medicine use. Specific medicines may be either safe or unsafe to take during pregnancy.  Take a prenatal vitamin that contains at least 600 micrograms (mcg) of folic acid.  If you develop constipation, try taking a stool softener if your health care provider approves. Eating and drinking   Eat a balanced diet that includes fresh fruits and vegetables, whole grains, good sources of protein such as meat, eggs, or tofu,  and low-fat dairy. Your health care provider will help you determine the amount of weight gain that is right for you.  Avoid raw meat and uncooked cheese. These carry germs that can cause birth defects in the baby.  If you have low calcium intake from food, talk to your health care provider about whether you should take a daily calcium supplement.  Eat four or five small meals rather than three large meals a day.  Limit foods that are high in fat and processed sugars, such as fried and sweet foods.  To prevent constipation: ? Drink enough fluid to keep your urine clear or pale yellow. ? Eat foods that are high in fiber, such as fresh fruits and vegetables, whole grains, and beans. Activity  Exercise only as directed by your health care provider. Most women can continue their usual exercise routine during pregnancy. Try to exercise for 30 minutes at least 5 days a week. Stop exercising if you experience uterine contractions.  Avoid heavy lifting.  Do   not exercise in extreme heat or humidity, or at high altitudes.  Wear low-heel, comfortable shoes.  Practice good posture.  You may continue to have sex unless your health care provider tells you otherwise. Relieving pain and discomfort  Take frequent breaks and rest with your legs elevated if you have leg cramps or low back pain.  Take warm sitz baths to soothe any pain or discomfort caused by hemorrhoids. Use hemorrhoid cream if your health care provider approves.  Wear a good support bra to prevent discomfort from breast tenderness.  If you develop varicose veins: ? Wear support pantyhose or compression stockings as told by your healthcare provider. ? Elevate your feet for 15 minutes, 3-4 times a day. Prenatal care  Write down your questions. Take them to your prenatal visits.  Keep all your prenatal visits as told by your health care provider. This is important. Safety  Wear your seat belt at all times when driving.  Make  a list of emergency phone numbers, including numbers for family, friends, the hospital, and police and fire departments. General instructions  Avoid cat litter boxes and soil used by cats. These carry germs that can cause birth defects in the baby. If you have a cat, ask someone to clean the litter box for you.  Do not travel far distances unless it is absolutely necessary and only with the approval of your health care provider.  Do not use hot tubs, steam rooms, or saunas.  Do not drink alcohol.  Do not use any products that contain nicotine or tobacco, such as cigarettes and e-cigarettes. If you need help quitting, ask your health care provider.  Do not use any medicinal herbs or unprescribed drugs. These chemicals affect the formation and growth of the baby.  Do not douche or use tampons or scented sanitary pads.  Do not cross your legs for long periods of time.  To prepare for the arrival of your baby: ? Take prenatal classes to understand, practice, and ask questions about labor and delivery. ? Make a trial run to the hospital. ? Visit the hospital and tour the maternity area. ? Arrange for maternity or paternity leave through employers. ? Arrange for family and friends to take care of pets while you are in the hospital. ? Purchase a rear-facing car seat and make sure you know how to install it in your car. ? Pack your hospital bag. ? Prepare the baby's nursery. Make sure to remove all pillows and stuffed animals from the baby's crib to prevent suffocation.  Visit your dentist if you have not gone during your pregnancy. Use a soft toothbrush to brush your teeth and be gentle when you floss. Contact a health care provider if:  You are unsure if you are in labor or if your water has broken.  You become dizzy.  You have mild pelvic cramps, pelvic pressure, or nagging pain in your abdominal area.  You have lower back pain.  You have persistent nausea, vomiting, or  diarrhea.  You have an unusual or bad smelling vaginal discharge.  You have pain when you urinate. Get help right away if:  Your water breaks before 37 weeks.  You have regular contractions less than 5 minutes apart before 37 weeks.  You have a fever.  You are leaking fluid from your vagina.  You have spotting or bleeding from your vagina.  You have severe abdominal pain or cramping.  You have rapid weight loss or weight gain.  You have   shortness of breath with chest pain.  You notice sudden or extreme swelling of your face, hands, ankles, feet, or legs.  Your baby makes fewer than 10 movements in 2 hours.  You have severe headaches that do not go away when you take medicine.  You have vision changes. Summary  The third trimester is from week 28 through week 40, months 7 through 9. The third trimester is a time when the unborn baby (fetus) is growing rapidly.  During the third trimester, your discomfort may increase as you and your baby continue to gain weight. You may have abdominal, leg, and back pain, sleeping problems, and an increased need to urinate.  During the third trimester your breasts will keep growing and they will continue to become tender. A yellow fluid (colostrum) may leak from your breasts. This is the first milk you are producing for your baby.  False labor is a condition in which you feel small, irregular tightenings of the muscles in the womb (contractions) that eventually go away. These are called Braxton Hicks contractions. Contractions may last for hours, days, or even weeks before true labor sets in.  Signs of labor can include: abdominal cramps; regular contractions that start at 10 minutes apart and become stronger and more frequent with time; watery or bloody mucus discharge that comes from the vagina; increased pelvic pressure and dull back pain; and leaking of amniotic fluid. This information is not intended to replace advice given to you by your  health care provider. Make sure you discuss any questions you have with your health care provider. Document Revised: 05/30/2018 Document Reviewed: 03/14/2016 Elsevier Patient Education  2020 [employer]  

## 2019-04-22 NOTE — Progress Notes (Signed)
   PRENATAL VISIT NOTE  Subjective:  Danielle Levine is a 19 y.o. G1P0 at [redacted]w[redacted]d being seen today for ongoing prenatal care.  She is currently monitored for the following issues for this low-risk pregnancy and has Supervision of normal pregnancy, antepartum and Alpha thalassemia silent carrier on their problem list.  Patient reports no complaints.  Contractions: Not present. Vag. Bleeding: None.  Movement: Present. Denies leaking of fluid.   The following portions of the patient's history were reviewed and updated as appropriate: allergies, current medications, past family history, past medical history, past social history, past surgical history and problem list.   Objective:   Vitals:   04/22/19 0819  BP: 105/66  Pulse: 99  Weight: 125 lb (56.7 kg)    Fetal Status: Fetal Heart Rate (bpm): 145 Fundal Height: 27 cm Movement: Present     General:  Alert, oriented and cooperative. Patient is in no acute distress.  Skin: Skin is warm and dry. No rash noted.   Cardiovascular: Normal heart rate noted  Respiratory: Normal respiratory effort, no problems with respiration noted  Abdomen: Soft, gravid, appropriate for gestational age.  Pain/Pressure: Absent     Pelvic: Cervical exam deferred        Extremities: Normal range of motion.  Edema: None  Mental Status: Normal mood and affect. Normal behavior. Normal judgment and thought content.   Assessment and Plan:  Pregnancy: G1P0 at [redacted]w[redacted]d 1. Encounter for supervision of normal first pregnancy in third trimester --Anticipatory guidance about next visits/weeks of pregnancy given. --Discussed recommended weight gain in pregnancy of 25-35 lbs, pt weight gain so far is 10 lbs.  Discussed options for healthy, nutrient dense calories daily.  Questions answered. - Glucose Tolerance, 2 Hours w/1 Hour - RPR - HIV Antibody (routine testing w rflx) - CBC  Preterm labor symptoms and general obstetric precautions including but not limited to vaginal  bleeding, contractions, leaking of fluid and fetal movement were reviewed in detail with the patient. Please refer to After Visit Summary for other counseling recommendations.   Return in about 4 weeks (around 05/20/2019).  No future appointments.  Sharen Counter, CNM

## 2019-04-23 ENCOUNTER — Encounter: Payer: Self-pay | Admitting: Medical

## 2019-04-23 LAB — HIV ANTIBODY (ROUTINE TESTING W REFLEX): HIV Screen 4th Generation wRfx: NONREACTIVE

## 2019-04-23 LAB — GLUCOSE TOLERANCE, 2 HOURS W/ 1HR
Glucose, 1 hour: 155 mg/dL (ref 65–179)
Glucose, 2 hour: 134 mg/dL (ref 65–152)
Glucose, Fasting: 74 mg/dL (ref 65–91)

## 2019-04-23 LAB — CBC
Hematocrit: 34.9 % (ref 34.0–46.6)
Hemoglobin: 11.4 g/dL (ref 11.1–15.9)
MCH: 29.3 pg (ref 26.6–33.0)
MCHC: 32.7 g/dL (ref 31.5–35.7)
MCV: 90 fL (ref 79–97)
Platelets: 272 10*3/uL (ref 150–450)
RBC: 3.89 x10E6/uL (ref 3.77–5.28)
RDW: 12.3 % (ref 11.7–15.4)
WBC: 12.7 10*3/uL — ABNORMAL HIGH (ref 3.4–10.8)

## 2019-04-23 LAB — RPR: RPR Ser Ql: NONREACTIVE

## 2019-05-20 ENCOUNTER — Telehealth (INDEPENDENT_AMBULATORY_CARE_PROVIDER_SITE_OTHER): Payer: BC Managed Care – PPO | Admitting: Advanced Practice Midwife

## 2019-05-20 DIAGNOSIS — Z3403 Encounter for supervision of normal first pregnancy, third trimester: Secondary | ICD-10-CM

## 2019-05-20 NOTE — Progress Notes (Signed)
Virtual ROB   CC:    

## 2019-05-20 NOTE — Progress Notes (Signed)
   TELEHEALTH VIRTUAL OBSTETRICS VISIT ENCOUNTER NOTE  I connected with Danielle Levine on 05/20/19 at  1:00 PM EDT by MyChart virtual visit at home and verified that I am speaking with the correct person using two identifiers.   I discussed the limitations, risks, security and privacy concerns of performing an evaluation and management service by telephone and the availability of in person appointments. I also discussed with the patient that there may be a patient responsible charge related to this service. The patient expressed understanding and agreed to proceed.  Subjective:  Danielle Levine is a 19 y.o. G1P0 at [redacted]w[redacted]d pt of [facility] being followed for ongoing prenatal care.  She is currently monitored for the following issues for this low-risk pregnancy and has Supervision of normal pregnancy, antepartum and Alpha thalassemia silent carrier on their problem list.  Patient reports occasional contractions. Reports fetal movement. Denies any contractions, bleeding or leaking of fluid.   The following portions of the patient's history were reviewed and updated as appropriate: allergies, current medications, past family history, past medical history, past social history, past surgical history and problem list.   Objective:   General:  Alert, oriented and cooperative.   Mental Status: Normal mood and affect perceived. Normal judgment and thought content.  Rest of physical exam deferred due to type of encounter  Assessment and Plan:  Pregnancy: G1P0 at [redacted]w[redacted]d 1. Encounter for supervision of normal first pregnancy in third trimester --Pt reports good fetal movement, denies cramping, LOF, or vaginal bleeding --BP reviewed and 110/70s on home cuff --Next visit in office at 36 weeks for GBS  Preterm labor symptoms and general obstetric precautions including but not limited to vaginal bleeding, contractions, leaking of fluid and fetal movement were reviewed in detail with the patient.  I discussed  the assessment and treatment plan with the patient. The patient was provided an opportunity to ask questions and all were answered. The patient agreed with the plan and demonstrated an understanding of the instructions. The patient was advised to call back or seek an in-person office evaluation/go to [facility] at [facility] for any urgent or concerning symptoms. Please refer to After Visit Summary for other counseling recommendations.   I provided 10 minutes of face-to-face time during this encounter.  Return in about 4 weeks (around 06/17/2019).  No future appointments.  Sharen Counter, CNM [facility] [employer]

## 2019-06-11 ENCOUNTER — Inpatient Hospital Stay (HOSPITAL_COMMUNITY)
Admission: AD | Admit: 2019-06-11 | Discharge: 2019-06-11 | Disposition: A | Discharge status: Home / Self Care | Payer: BC Managed Care – PPO | Attending: Family Medicine | Admitting: Family Medicine

## 2019-06-11 ENCOUNTER — Encounter (HOSPITAL_COMMUNITY): Payer: Self-pay | Admitting: Family Medicine

## 2019-06-11 ENCOUNTER — Other Ambulatory Visit: Payer: Self-pay

## 2019-06-11 DIAGNOSIS — O26893 Other specified pregnancy related conditions, third trimester: Secondary | ICD-10-CM | POA: Diagnosis not present

## 2019-06-11 DIAGNOSIS — O4703 False labor before 37 completed weeks of gestation, third trimester: Secondary | ICD-10-CM | POA: Insufficient documentation

## 2019-06-11 DIAGNOSIS — N898 Other specified noninflammatory disorders of vagina: Secondary | ICD-10-CM | POA: Diagnosis not present

## 2019-06-11 DIAGNOSIS — O479 False labor, unspecified: Secondary | ICD-10-CM

## 2019-06-11 DIAGNOSIS — Z3A35 35 weeks gestation of pregnancy: Secondary | ICD-10-CM | POA: Diagnosis not present

## 2019-06-11 LAB — WET PREP, GENITAL
Clue Cells Wet Prep HPF POC: NONE SEEN
Sperm: NONE SEEN
Trich, Wet Prep: NONE SEEN
Yeast Wet Prep HPF POC: NONE SEEN

## 2019-06-11 LAB — URINALYSIS, ROUTINE W REFLEX MICROSCOPIC
Bilirubin Urine: NEGATIVE
Glucose, UA: NEGATIVE mg/dL
Hgb urine dipstick: NEGATIVE
Ketones, ur: 5 mg/dL — AB
Leukocytes,Ua: NEGATIVE
Nitrite: NEGATIVE
Protein, ur: NEGATIVE mg/dL
Specific Gravity, Urine: 1.009 (ref 1.005–1.030)
pH: 7 (ref 5.0–8.0)

## 2019-06-11 NOTE — MAU Provider Note (Signed)
CC:  Chief Complaint  Patient presents with  . Abdominal Pain  . Back Pain  . Vaginal Discharge  . Diarrhea     First Provider Initiated Contact with Patient 06/11/19 1517      HPI: Danielle Levine is a 19 y.o. year old G1P0 female at [redacted]w[redacted]d weeks gestation who presents to MAU reporting losing her mucous plug and cramping x1 week.  Associated Sx: Positive for loose stools.  Negative for urinary complaints. Vaginal bleeding: Denies Leaking of fluid: Denies Fetal movement: Normal  O:  Patient Vitals for the past 24 hrs:  BP Temp Temp src Pulse Resp SpO2 Height Weight  06/11/19 1436 -- 98.3 F (36.8 C) Oral -- -- -- -- --  06/11/19 1359 127/69 99.5 F (37.5 C) Oral 98 16 100 % 5\' 3"  (1.6 m) 60.7 kg    General: NAD Heart: Regular rate Lungs: Normal rate and effort Abd: Soft, NT, Gravid, S=D Pelvic: NEFG, negative LOF or blood.  Dilation: Closed Effacement (%): 50 Cervical Position: Posterior Station: Ballotable Presentation: Vertex Exam by:: [email or web address], CNM  EFM: 130, Moderate variability, 15 x 15 accelerations, no decelerations Toco: Irregular, mild  Results for orders placed or performed during the hospital encounter of 06/11/19 (from the past 24 hour(s))  Urinalysis, Routine w reflex microscopic     Status: Abnormal   Collection Time: 06/11/19  2:18 PM  Result Value Ref Range   Color, Urine YELLOW YELLOW   APPearance CLEAR CLEAR   Specific Gravity, Urine 1.009 1.005 - 1.030   pH 7.0 5.0 - 8.0   Glucose, UA NEGATIVE NEGATIVE mg/dL   Hgb urine dipstick NEGATIVE NEGATIVE   Bilirubin Urine NEGATIVE NEGATIVE   Ketones, ur 5 (A) NEGATIVE mg/dL   Protein, ur NEGATIVE NEGATIVE mg/dL   Nitrite NEGATIVE NEGATIVE   Leukocytes,Ua NEGATIVE NEGATIVE  Wet prep, genital     Status: Abnormal   Collection Time: 06/11/19  3:58 PM   Specimen: Vaginal; Genital  Result Value Ref Range   Yeast Wet Prep HPF POC NONE SEEN NONE SEEN   Trich, Wet Prep NONE SEEN NONE SEEN   Clue  Cells Wet Prep HPF POC NONE SEEN NONE SEEN   WBC, Wet Prep HPF POC FEW (A) NONE SEEN   Sperm NONE SEEN      Orders Placed This Encounter  Procedures  . Wet prep, genital  . Urinalysis, Routine w reflex microscopic  . Discharge patient   MDM - Braxton Hick's contractions w/ out evidence of active preterm labor.   A: [redacted]w[redacted]d week IUP 1. Braxton Hicks contractions   2. Vaginal discharge during pregnancy in third trimester   FHR reactive  P: Discharge home in stable condition. Preterm labor precautions and fetal kick counts. Follow-up as scheduled for prenatal visit or sooner as needed if symptoms worsen. Return to maternity admissions as needed if symptoms worsen.  [redacted]w[redacted]d, Katrinka Blazing, [place] 06/11/2019 4:08 PM  3

## 2019-06-11 NOTE — MAU Note (Signed)
Has been having some slight cramping, has been shedding some of her mucous plug.  Has had some diarrhea(once or twice a day x 2 days) and some back pain. Been going on about a wk, wanting to make sure she in not going into labor.

## 2019-06-11 NOTE — Discharge Instructions (Signed)
Braxton Hicks Contractions °Contractions of the uterus can occur throughout pregnancy, but they are not always a sign that you are in labor. You may have practice contractions called Braxton Hicks contractions. These false labor contractions are sometimes confused with true labor. °What are Braxton Hicks contractions? °Braxton Hicks contractions are tightening movements that occur in the muscles of the uterus before labor. Unlike true labor contractions, these contractions do not result in opening (dilation) and thinning of the cervix. Toward the end of pregnancy (32-34 weeks), Braxton Hicks contractions can happen more often and may become stronger. These contractions are sometimes difficult to tell apart from true labor because they can be very uncomfortable. You should not feel embarrassed if you go to the hospital with false labor. °Sometimes, the only way to tell if you are in true labor is for your health care provider to look for changes in the cervix. The health care provider will do a physical exam and may monitor your contractions. If you are not in true labor, the exam should show that your cervix is not dilating and your water has not broken. °If there are no other health problems associated with your pregnancy, it is completely safe for you to be sent home with false labor. You may continue to have Braxton Hicks contractions until you go into true labor. °How to tell the difference between true labor and false labor °True labor °· Contractions last 30-70 seconds. °· Contractions become very regular. °· Discomfort is usually felt in the top of the uterus, and it spreads to the lower abdomen and low back. °· Contractions do not go away with walking. °· Contractions usually become more intense and increase in frequency. °· The cervix dilates and gets thinner. °False labor °· Contractions are usually shorter and not as strong as true labor contractions. °· Contractions are usually irregular. °· Contractions  are often felt in the front of the lower abdomen and in the groin. °· Contractions may go away when you walk around or change positions while lying down. °· Contractions get weaker and are shorter-lasting as time goes on. °· The cervix usually does not dilate or become thin. °Follow these instructions at home: ° °· Take over-the-counter and prescription medicines only as told by your health care provider. °· Keep up with your usual exercises and follow other instructions from your health care provider. °· Eat and drink lightly if you think you are going into labor. °· If Braxton Hicks contractions are making you uncomfortable: °? Change your position from lying down or resting to walking, or change from walking to resting. °? Sit and rest in a tub of warm water. °? Drink enough fluid to keep your urine pale yellow. Dehydration may cause these contractions. °? Do slow and deep breathing several times an hour. °· Keep all follow-up prenatal visits as told by your health care provider. This is important. °Contact a health care provider if: °· You have a fever. °· You have continuous pain in your abdomen. °Get help right away if: °· Your contractions become stronger, more regular, and closer together. °· You have fluid leaking or gushing from your vagina. °· You pass blood-tinged mucus (bloody show). °· You have bleeding from your vagina. °· You have low back pain that you never had before. °· You feel your baby’s head pushing down and causing pelvic pressure. °· Your baby is not moving inside you as much as it used to. °Summary °· Contractions that occur before labor are   called Braxton Hicks contractions, false labor, or practice contractions. °· Braxton Hicks contractions are usually shorter, weaker, farther apart, and less regular than true labor contractions. True labor contractions usually become progressively stronger and regular, and they become more frequent. °· Manage discomfort from Braxton Hicks contractions  by changing position, resting in a warm bath, drinking plenty of water, or practicing deep breathing. °This information is not intended to replace advice given to you by your health care provider. Make sure you discuss any questions you have with your health care provider. °Document Revised: 01/19/2017 Document Reviewed: 06/22/2016 °Elsevier Patient Education © 2020 [employer] Inc. ° °

## 2019-06-17 ENCOUNTER — Other Ambulatory Visit: Payer: Self-pay

## 2019-06-17 ENCOUNTER — Other Ambulatory Visit (HOSPITAL_COMMUNITY)
Admission: RE | Admit: 2019-06-17 | Discharge: 2019-06-17 | Disposition: A | Discharge status: Home / Self Care | Payer: BC Managed Care – PPO | Source: Ambulatory Visit | Attending: Advanced Practice Midwife | Admitting: Advanced Practice Midwife

## 2019-06-17 ENCOUNTER — Ambulatory Visit (INDEPENDENT_AMBULATORY_CARE_PROVIDER_SITE_OTHER): Payer: BC Managed Care – PPO | Admitting: Advanced Practice Midwife

## 2019-06-17 VITALS — BP 109/69 | HR 77 | Wt 135.0 lb

## 2019-06-17 DIAGNOSIS — Z3A36 36 weeks gestation of pregnancy: Secondary | ICD-10-CM

## 2019-06-17 DIAGNOSIS — O26843 Uterine size-date discrepancy, third trimester: Secondary | ICD-10-CM

## 2019-06-17 DIAGNOSIS — O99713 Diseases of the skin and subcutaneous tissue complicating pregnancy, third trimester: Secondary | ICD-10-CM

## 2019-06-17 DIAGNOSIS — Z3403 Encounter for supervision of normal first pregnancy, third trimester: Secondary | ICD-10-CM | POA: Diagnosis not present

## 2019-06-17 DIAGNOSIS — L299 Pruritus, unspecified: Secondary | ICD-10-CM

## 2019-06-17 MED ORDER — HYDROXYZINE PAMOATE 25 MG PO CAPS: 25.0000 mg | ORAL_CAPSULE | Freq: Four times a day (QID) | ORAL | 0 refills | Status: DC | PRN

## 2019-06-17 NOTE — Progress Notes (Signed)
   PRENATAL VISIT NOTE  Subjective:  Danielle Levine is a 19 y.o. G1P0 at 79w1dbeing seen today for ongoing prenatal care.  She is currently monitored for the following issues for this low-risk pregnancy and has Supervision of normal pregnancy, antepartum and Alpha thalassemia silent carrier on their problem list.  Patient reports occasional contractions.  Contractions: Not present. Vag. Bleeding: None.  Movement: Present. Denies leaking of fluid.   The following portions of the patient's history were reviewed and updated as appropriate: allergies, current medications, past family history, past medical history, past social history, past surgical history and problem list.   Objective:   Vitals:   06/17/19 1453  BP: 109/69  Pulse: 77  Weight: 135 lb (61.2 kg)    Fetal Status: Fetal Heart Rate (bpm): 156 Fundal Height: 33 cm Movement: Present  Presentation: Vertex  General:  Alert, oriented and cooperative. Patient is in no acute distress.  Skin: Skin is warm and dry. No rash noted.   Cardiovascular: Normal heart rate noted  Respiratory: Normal respiratory effort, no problems with respiration noted  Abdomen: Soft, gravid, appropriate for gestational age.  Pain/Pressure: Present     Pelvic: Cervical exam performed in the presence of a chaperone Dilation: 1 Effacement (%): 50 Station: -2  Extremities: Normal range of motion.  Edema: None  Mental Status: Normal mood and affect. Normal behavior. Normal judgment and thought content.   Assessment and Plan:  Pregnancy: G1P0 at 369w1d. Encounter for supervision of normal first pregnancy in third trimester --Anticipatory guidance about next visits/weeks of pregnancy given. --Next visit in 2 weeks virtual, depending upon bile acid and US[place]ore[place]ults (see below)  - Cervicovaginal ancillary only( [place]) - Strep Gp B NAA  2. Pruritus of skin --Itching is generalized but worse on soles of feet and palms of hands. No visible rash.   --Rx  for Vistaril 25 mg Q 6 hours PRN for itching - Bile acids, total - Comp Met (CMET)  3. Uterine size date discrepancy pregnancy, third trimester --FH 33 cm today at 36 weeks.  Good fetal movement, bile acid lab results pending.  Growth US[place]oreardered. - US[place]FM OB FOLLOW UP; Future  Term labor symptoms and general obstetric precautions including but not limited to vaginal bleeding, contractions, leaking of fluid and fetal movement were reviewed in detail with the patient. Please refer to After Visit Summary for other counseling recommendations.   Return in about 2 weeks (around 07/01/2019).  No future appointments.  LiFatima BlankCNM

## 2019-06-17 NOTE — Patient Instructions (Signed)
Labor Precautions Reasons to come to [facility] at  [facility]:  1.  Contractions are  5 minutes apart or less, each last 1 minute, these have been going on for 1-2 hours, and you cannot walk or talk during them 2.  You have a large gush of fluid, or a trickle of fluid that will not stop and you have to wear a pad 3.  You have bleeding that is bright red, heavier than spotting--like menstrual bleeding (spotting can be normal in early labor or after a check of your cervix) 4.  You do not feel the baby moving like he/she normally does  Things to Try After 37 weeks to Encourage Labor/Get Ready for Labor:   1.  Try the Miles Circuit at www.milescircuit.com daily to improve baby's position and encourage the onset of labor.  2. Walk a little and rest a little every day.  Change positions often.  3. Cervical Ripening: May try one or both a. Red Raspberry Leaf capsules or tea:  two 300mg or 400mg tablets with each meal, 2-3 times a day, or 1-3 cups of tea daily  Potential Side Effects Of Raspberry Leaf:  Most women do not experience any side effects from drinking raspberry leaf tea. However, nausea and loose stools are possible   b. Evening Primrose Oil capsules: may take 1 to 3 capsules daily. May also prick one to release the oil and insert it into your vagina at night.  Some of the potential side effects:  Upset stomach  Loose stools or diarrhea  Headaches  Nausea  4. Sex (and especially sex with orgasm) can also help the cervix ripen and encourage labor onset.   

## 2019-06-17 NOTE — Progress Notes (Signed)
Patient reports fetal movement and pressure. 

## 2019-06-18 LAB — CERVICOVAGINAL ANCILLARY ONLY
Chlamydia: NEGATIVE
Comment: NEGATIVE
Comment: NORMAL
Neisseria Gonorrhea: NEGATIVE

## 2019-06-19 ENCOUNTER — Encounter: Payer: Self-pay | Admitting: Advanced Practice Midwife

## 2019-06-19 DIAGNOSIS — O99820 Streptococcus B carrier state complicating pregnancy: Secondary | ICD-10-CM | POA: Insufficient documentation

## 2019-06-19 LAB — COMPREHENSIVE METABOLIC PANEL
ALT: 7 IU/L (ref 0–32)
AST: 17 IU/L (ref 0–40)
Albumin/Globulin Ratio: 2 (ref 1.2–2.2)
Albumin: 4.1 g/dL (ref 3.9–5.0)
Alkaline Phosphatase: 202 IU/L — ABNORMAL HIGH (ref 39–117)
BUN/Creatinine Ratio: 12 (ref 9–23)
BUN: 7 mg/dL (ref 6–20)
Bilirubin Total: 0.6 mg/dL (ref 0.0–1.2)
CO2: 21 mmol/L (ref 20–29)
Calcium: 9.7 mg/dL (ref 8.7–10.2)
Chloride: 103 mmol/L (ref 96–106)
Creatinine, Ser: 0.6 mg/dL (ref 0.57–1.00)
GFR calc Af Amer: 152 mL/min/{1.73_m2} (ref >59)
GFR calc non Af Amer: 132 mL/min/{1.73_m2} (ref >59)
Globulin, Total: 2.1 g/dL (ref 1.5–4.5)
Glucose: 66 mg/dL (ref 65–99)
Potassium: 4.4 mmol/L (ref 3.5–5.2)
Sodium: 137 mmol/L (ref 134–144)
Total Protein: 6.2 g/dL (ref 6.0–8.5)

## 2019-06-19 LAB — BILE ACIDS, TOTAL: Bile Acids Total: 7 umol/L (ref 0.0–10.0)

## 2019-06-19 LAB — STREP GP B NAA: Strep Gp B NAA: POSITIVE — AB

## 2019-06-26 ENCOUNTER — Ambulatory Visit (HOSPITAL_COMMUNITY): Payer: BC Managed Care – PPO | Attending: Obstetrics and Gynecology

## 2019-06-26 ENCOUNTER — Other Ambulatory Visit: Payer: Self-pay

## 2019-06-26 ENCOUNTER — Ambulatory Visit: Payer: BC Managed Care – PPO | Admitting: *Deleted

## 2019-06-26 ENCOUNTER — Encounter: Payer: Self-pay | Admitting: *Deleted

## 2019-06-26 DIAGNOSIS — O99820 Streptococcus B carrier state complicating pregnancy: Secondary | ICD-10-CM | POA: Insufficient documentation

## 2019-06-26 DIAGNOSIS — O26843 Uterine size-date discrepancy, third trimester: Secondary | ICD-10-CM | POA: Diagnosis not present

## 2019-06-26 DIAGNOSIS — Z363 Encounter for antenatal screening for malformations: Secondary | ICD-10-CM

## 2019-06-26 DIAGNOSIS — Z3A37 37 weeks gestation of pregnancy: Secondary | ICD-10-CM | POA: Diagnosis not present

## 2019-06-26 DIAGNOSIS — Z148 Genetic carrier of other disease: Secondary | ICD-10-CM

## 2019-07-01 ENCOUNTER — Encounter: Payer: Self-pay | Admitting: Obstetrics

## 2019-07-01 ENCOUNTER — Telehealth (INDEPENDENT_AMBULATORY_CARE_PROVIDER_SITE_OTHER): Payer: BC Managed Care – PPO | Admitting: Obstetrics

## 2019-07-01 ENCOUNTER — Encounter: Payer: Self-pay | Admitting: Advanced Practice Midwife

## 2019-07-01 VITALS — BP 115/72 | HR 88

## 2019-07-01 DIAGNOSIS — Z3A38 38 weeks gestation of pregnancy: Secondary | ICD-10-CM

## 2019-07-01 DIAGNOSIS — L299 Pruritus, unspecified: Secondary | ICD-10-CM

## 2019-07-01 DIAGNOSIS — O26849 Uterine size-date discrepancy, unspecified trimester: Secondary | ICD-10-CM | POA: Insufficient documentation

## 2019-07-01 DIAGNOSIS — Z3403 Encounter for supervision of normal first pregnancy, third trimester: Secondary | ICD-10-CM

## 2019-07-01 DIAGNOSIS — O99820 Streptococcus B carrier state complicating pregnancy: Secondary | ICD-10-CM

## 2019-07-01 DIAGNOSIS — M549 Dorsalgia, unspecified: Secondary | ICD-10-CM

## 2019-07-01 MED ORDER — COMFORT FIT MATERNITY SUPP SM MISC: 0 refills | Status: AC

## 2019-07-01 NOTE — Progress Notes (Signed)
OBSTETRICS PRENATAL VIRTUAL VISIT ENCOUNTER NOTE  [facility]   I connected with Shawnie PonsMiracle Bruno on 07/01/19 at 10:15 AM EDT by MyChart Video Encounter at home and verified that I am speaking with the correct person using two identifiers.   I discussed the limitations, risks, security and privacy concerns of performing an evaluation and management service virtually and the availability of in person appointments. I also discussed with the patient that there may be a patient responsible charge related to this service. The patient expressed understanding and agreed to proceed.  Subjective:  Catrina Eliberto Ivoryustin is a 19 y.o. G1P0 at 518w1d being seen today for ongoing prenatal care.  She is currently monitored for the following issues for this low-risk pregnancy and has Supervision of normal pregnancy, antepartum; Alpha thalassemia silent carrier; and GBS (group B Streptococcus carrier), +RV culture, currently pregnant on their problem list.  Patient reports heartburn and backache.  Contractions: Irritability. Vag. Bleeding: None.  Movement: Present. Denies any leaking of fluid.   The following portions of the patient's history were reviewed and updated as appropriate: allergies, current medications, past family history, past medical history, past social history, past surgical history and problem list.   Objective:   Vitals:   07/01/19 1010  BP: 115/72  Pulse: 88    Fetal Status:     Movement: Present     General:  Alert, oriented and cooperative. Patient is in no acute distress.  Respiratory: Normal respiratory effort, no problems with respiration noted  Mental Status: Normal mood and affect. Normal behavior. Normal judgment and thought content.  Rest of physical exam deferred due to type of encounter  Imaging: US MFM OB FOLLOW UP  Result Date: 06/26/2019 ----------------------------------------------------------------------  OBSTETRICS REPORT                          (Signed Final 06/26/2019 04:53 pm) ---------------------------------------------------------------------- Patient Info  ID #:        [id number]                          D.O.B.:  05/19/2000 (19 yrs)  Name:        Nadea Rothschild                  Visit Date: 06/26/2019 02:14 pm ---------------------------------------------------------------------- Performed By  Attending:         Lin Landsmanorenthian Booker      Ref. Address:      [address]                     [place]                                        [address],                                                               [place] [id number]  Performed By:      Marcellina MillinKelly L Moser RDMS     [facility]  Referred By:       Marny Lowenstein PA ---------------------------------------------------------------------- Orders  #   Description                          Code         Ordered By  1   [place] MFM OB FOLLOW UP                  [phone]     LISA LEFTWICH-KIRBY ----------------------------------------------------------------------  #   Order #                    Accession #                 Episode #  1   [id number]                  [id number]                  [id number] ---------------------------------------------------------------------- Indications  Uterine size-date discrepancy, third trimester  O26.843  [redacted] weeks gestation of pregnancy                 Z3A.37  Encounter for antenatal screening for           Z36.3  malformations (low risk panorama)  Genetic carrier (specify)                       Z14.8 ---------------------------------------------------------------------- Fetal Evaluation  Num Of Fetuses:          1  Fetal Heart Rate(bpm):   150  Cardiac Activity:        Observed  Presentation:            Cephalic  Placenta:                Posterior  P. Cord Insertion:       Previously Visualized  Amniotic Fluid  AFI FV:      Within normal limits  AFI Sum(cm)     %Tile        Largest Pocket(cm)  10.6            29          4.8  RUQ(cm)       RLQ(cm)        LUQ(cm)        LLQ(cm)  2.2           1.4            4.8            2.2 ---------------------------------------------------------------------- Biometry  BPD:      89.7   mm     G. Age:  36w 2d         37  %    CI:         76.14  %    70 - 86                                                           FL/HC:       20.8  %    20.8 - 22.6  HC:      325.8   mm     G.  Age:  36w 6d         17  %    HC/AC:       1.01       0.92 - 1.05  AC:      322.4   mm     G. Age:  36w 1d         29  %    FL/BPD:      75.7  %    71 - 87  FL:       67.9   mm     G. Age:  34w 6d        4.2  %    FL/AC:       21.1  %    20 - 24  Est. FW:    2805   gm      6 lb 3 oz     21  % ---------------------------------------------------------------------- OB History  Gravidity:     1         Term:  0          Prem:  0        SAB:   0  TOP:           0       Ectopic: 0         Living: 0 ---------------------------------------------------------------------- Gestational Age  LMP:            37w 3d       Date:  10/07/18                   EDD:  07/14/19  U/S Today:      36w 0d                                         EDD:  07/24/19  Best:           37w 3d    Det. By:  LMP  (10/07/18)            EDD:  07/14/19 ---------------------------------------------------------------------- Anatomy  Cranium:                Appears normal         Aortic Arch:            Previously seen  Cavum:                  Previously seen        Ductal Arch:            Previously seen  Ventricles:             Appears normal         Diaphragm:              Previously seen  Choroid Plexus:         Previously seen        Stomach:                Appears normal, left  sided  Cerebellum:             Appears normal         Abdomen:                Appears normal  Posterior Fossa:        Previously seen        Abdominal Wall:          Previously seen  Nuchal Fold:            Not applicable (>20    Cord Vessels:           Previously seen                          wks GA)  Face:                   Orbits and profile     Kidneys:                Appear normal                          previously seen  Lips:                   Previously seen        Bladder:                Appears normal  Thoracic:               Appears normal         Spine:                  Previously seen  Heart:                  Previously seen        Upper Extremities:      Previously seen  RVOT:                   Previously seen        Lower Extremities:      Previously seen  LVOT:                   Appears normal  Other:   Fetus appears to be a female. Heels and 5th digit previously visualized.           3VV and 3VTV previously visualized. Nasal bone previously visualized. ---------------------------------------------------------------------- Cervix Uterus Adnexa  Cervix  Not visualized (advanced GA >24wks) ---------------------------------------------------------------------- Impression  Follow up growth given size > dates  Normal interval growth with normal fluid and fetal movement ---------------------------------------------------------------------- Recommendations  Follow up growth as clinically indicated. ----------------------------------------------------------------------               Lin Landsman, MD Electronically Signed Final Report   06/26/2019 04:53 pm ----------------------------------------------------------------------   Assessment and Plan:  Pregnancy: G1P0 at [redacted]w[redacted]d 1. Encounter for supervision of normal first pregnancy in third trimester  2. GBS (group B Streptococcus carrier), +RV culture, currently pregnant - treat in labor  3. Backache symptom Rx: - Elastic Bandages & Supports (COMFORT FIT MATERNITY SUPP SM) MISC; Wear as directed.  Dispense: 1 each; Refill: 0  4. Pruritus of skin - Vistaril prn   Preterm labor symptoms and general  obstetric precautions including but not limited to vaginal bleeding, contractions, leaking of fluid and fetal movement were reviewed in detail with  the patient. I discussed the assessment and treatment plan with the patient. The patient was provided an opportunity to ask questions and all were answered. The patient agreed with the plan and demonstrated an understanding of the instructions. The patient was advised to call back or seek an in-person office evaluation/go to MAU at [facility] for any urgent or concerning symptoms. Please refer to After Visit Summary for other counseling recommendations.   I provided 10 minutes of face-to-face time during this encounter.  [facility] [place] [facility] for Mychart ROB. Patient identified with 2 patient identifiers. She is [redacted]w[redacted]d. Her bp today is 115/72. Patient has no concerns today.  Shelly Bombard, MD 07/01/2019 10:40 AM.

## 2019-07-07 ENCOUNTER — Encounter (HOSPITAL_COMMUNITY): Payer: Self-pay | Admitting: Obstetrics and Gynecology

## 2019-07-07 ENCOUNTER — Inpatient Hospital Stay (HOSPITAL_COMMUNITY)
Admission: AD | Admit: 2019-07-07 | Discharge: 2019-07-09 | DRG: 807 | Disposition: A | Discharge status: Home / Self Care | Payer: BC Managed Care – PPO | Attending: Obstetrics and Gynecology | Admitting: Obstetrics and Gynecology

## 2019-07-07 ENCOUNTER — Inpatient Hospital Stay (EMERGENCY_DEPARTMENT_HOSPITAL)
Admission: AD | Admit: 2019-07-07 | Discharge: 2019-07-07 | Disposition: A | Discharge status: Home / Self Care | Payer: BC Managed Care – PPO | Source: Home / Self Care | Attending: Obstetrics and Gynecology | Admitting: Obstetrics and Gynecology

## 2019-07-07 ENCOUNTER — Inpatient Hospital Stay (HOSPITAL_COMMUNITY): Payer: BC Managed Care – PPO | Admitting: Anesthesiology

## 2019-07-07 ENCOUNTER — Other Ambulatory Visit: Payer: Self-pay

## 2019-07-07 DIAGNOSIS — Z3490 Encounter for supervision of normal pregnancy, unspecified, unspecified trimester: Secondary | ICD-10-CM

## 2019-07-07 DIAGNOSIS — O479 False labor, unspecified: Secondary | ICD-10-CM

## 2019-07-07 DIAGNOSIS — Z3A39 39 weeks gestation of pregnancy: Secondary | ICD-10-CM

## 2019-07-07 DIAGNOSIS — D563 Thalassemia minor: Secondary | ICD-10-CM | POA: Diagnosis present

## 2019-07-07 DIAGNOSIS — O99824 Streptococcus B carrier state complicating childbirth: Secondary | ICD-10-CM | POA: Diagnosis present

## 2019-07-07 DIAGNOSIS — Z3689 Encounter for other specified antenatal screening: Secondary | ICD-10-CM | POA: Diagnosis not present

## 2019-07-07 DIAGNOSIS — Z20822 Contact with and (suspected) exposure to covid-19: Secondary | ICD-10-CM | POA: Diagnosis present

## 2019-07-07 DIAGNOSIS — O26893 Other specified pregnancy related conditions, third trimester: Secondary | ICD-10-CM | POA: Diagnosis present

## 2019-07-07 DIAGNOSIS — O471 False labor at or after 37 completed weeks of gestation: Secondary | ICD-10-CM | POA: Insufficient documentation

## 2019-07-07 DIAGNOSIS — O99820 Streptococcus B carrier state complicating pregnancy: Secondary | ICD-10-CM

## 2019-07-07 LAB — CBC
HCT: 39 % (ref 36.0–46.0)
Hemoglobin: 12.8 g/dL (ref 12.0–15.0)
MCH: 30 pg (ref 26.0–34.0)
MCHC: 32.8 g/dL (ref 30.0–36.0)
MCV: 91.5 fL (ref 80.0–100.0)
Platelets: 242 10*3/uL (ref 150–400)
RBC: 4.26 MIL/uL (ref 3.87–5.11)
RDW: 14.1 % (ref 11.5–15.5)
WBC: 15.2 10*3/uL — ABNORMAL HIGH (ref 4.0–10.5)
nRBC: 0 % (ref 0.0–0.2)

## 2019-07-07 LAB — TYPE AND SCREEN
ABO/RH(D): AB POS
Antibody Screen: NEGATIVE

## 2019-07-07 LAB — SARS CORONAVIRUS 2 BY RT PCR (HOSPITAL ORDER, PERFORMED IN ~~LOC~~ HOSPITAL LAB): SARS Coronavirus 2: NEGATIVE

## 2019-07-07 LAB — ABO/RH: ABO/RH(D): AB POS

## 2019-07-07 MED ORDER — OXYTOCIN 40 UNITS IN NORMAL SALINE INFUSION - SIMPLE MED
2.5000 [IU]/h | INTRAVENOUS | Status: DC
  Filled 2019-07-07: qty 1000

## 2019-07-07 MED ORDER — SODIUM CHLORIDE 0.9 % IV SOLN
1.0000 g | Freq: Once | INTRAVENOUS | Status: AC
  Administered 2019-07-07: 1 g via INTRAVENOUS
  Filled 2019-07-07: qty 1000

## 2019-07-07 MED ORDER — OXYTOCIN BOLUS FROM INFUSION
500.0000 mL | Freq: Once | INTRAVENOUS | Status: AC
  Administered 2019-07-07: 500 mL via INTRAVENOUS

## 2019-07-07 MED ORDER — PHENYLEPHRINE 40 MCG/ML (10ML) SYRINGE FOR IV PUSH (FOR BLOOD PRESSURE SUPPORT): 80.0000 ug | PREFILLED_SYRINGE | INTRAVENOUS | Status: DC | PRN

## 2019-07-07 MED ORDER — LACTATED RINGERS IV SOLN: INTRAVENOUS | Status: DC

## 2019-07-07 MED ORDER — LACTATED RINGERS IV SOLN: 500.0000 mL | Freq: Once | INTRAVENOUS | Status: DC

## 2019-07-07 MED ORDER — SODIUM CHLORIDE (PF) 0.9 % IJ SOLN
INTRAMUSCULAR | Status: DC | PRN
  Administered 2019-07-07: 12 mL/h via EPIDURAL

## 2019-07-07 MED ORDER — FENTANYL CITRATE (PF) 100 MCG/2ML IJ SOLN
INTRAMUSCULAR | Status: AC
  Filled 2019-07-07: qty 2

## 2019-07-07 MED ORDER — LIDOCAINE HCL (PF) 1 % IJ SOLN
INTRAMUSCULAR | Status: DC | PRN
  Administered 2019-07-07: 10 mL via EPIDURAL

## 2019-07-07 MED ORDER — EPHEDRINE 5 MG/ML INJ: 10.0000 mg | INTRAVENOUS | Status: DC | PRN

## 2019-07-07 MED ORDER — DIPHENHYDRAMINE HCL 50 MG/ML IJ SOLN: 12.5000 mg | INTRAMUSCULAR | Status: DC | PRN

## 2019-07-07 MED ORDER — OXYTOCIN 40 UNITS IN NORMAL SALINE INFUSION - SIMPLE MED
1.0000 m[IU]/min | INTRAVENOUS | Status: DC
  Administered 2019-07-07: 2 m[IU]/min via INTRAVENOUS

## 2019-07-07 MED ORDER — FENTANYL CITRATE (PF) 100 MCG/2ML IJ SOLN
100.0000 ug | INTRAMUSCULAR | Status: DC | PRN
  Administered 2019-07-07: 100 ug via INTRAVENOUS

## 2019-07-07 MED ORDER — LACTATED RINGERS IV SOLN: 500.0000 mL | INTRAVENOUS | Status: DC | PRN

## 2019-07-07 MED ORDER — LIDOCAINE HCL (PF) 1 % IJ SOLN: 30.0000 mL | INTRAMUSCULAR | Status: DC | PRN

## 2019-07-07 MED ORDER — SODIUM CHLORIDE 0.9 % IV SOLN
2.0000 g | Freq: Once | INTRAVENOUS | Status: AC
  Administered 2019-07-07: 2 g via INTRAVENOUS
  Filled 2019-07-07: qty 2000

## 2019-07-07 MED ORDER — ONDANSETRON HCL 4 MG/2ML IJ SOLN: 4.0000 mg | Freq: Four times a day (QID) | INTRAMUSCULAR | Status: DC | PRN

## 2019-07-07 MED ORDER — ACETAMINOPHEN 325 MG PO TABS: 650.0000 mg | ORAL_TABLET | ORAL | Status: DC | PRN

## 2019-07-07 MED ORDER — SOD CITRATE-CITRIC ACID 500-334 MG/5ML PO SOLN: 30.0000 mL | ORAL | Status: DC | PRN

## 2019-07-07 MED ORDER — FENTANYL-BUPIVACAINE-NACL 0.5-0.125-0.9 MG/250ML-% EP SOLN
12.0000 mL/h | EPIDURAL | Status: DC | PRN
  Filled 2019-07-07: qty 250

## 2019-07-07 MED ORDER — TERBUTALINE SULFATE 1 MG/ML IJ SOLN: 0.2500 mg | Freq: Once | INTRAMUSCULAR | Status: DC | PRN

## 2019-07-07 NOTE — Discharge Instructions (Signed)

## 2019-07-07 NOTE — MAU Note (Signed)
Pt reports to MAU with complaints of contractions, that were 10 mins apart yesterday and are five mine apart today. Denies LOF but states she is having some brown VB. Endorses fetal movement.

## 2019-07-07 NOTE — Discharge Summary (Addendum)
Postpartum Discharge Summary      Patient Name: Danielle Levine DOB: April 06, 2000 MRN: [id number]  Date of admission: 07/07/2019 Delivery date:07/07/2019  Delivering provider: Pattricia Boss  Date of discharge: 07/09/2019  Admitting diagnosis: Labor and delivery, indication for care [O75.9] Intrauterine pregnancy: [redacted]w[redacted]d    Secondary diagnosis:  Active Problems:   Supervision of normal pregnancy, antepartum   Alpha thalassemia silent carrier   GBS (group B Streptococcus carrier), +RV culture, currently pregnant   Labor and delivery, indication for care  Additional problems: None    Discharge diagnosis: Term Pregnancy Delivered                                              Post partum procedures:None Augmentation: Pitocin Complications: None  Hospital course: Onset of Labor With Vaginal Delivery      19y.o. yo G1P0 at 337w0das admitted in Active Labor on 07/07/2019. Patient had an uncomplicated labor course as follows: Initial SVE: 6/90/-2. SROM occurred and patient progressed to 9.5 cm and was at 9.5 cm for a few hours. Pitocin started. She then progressed to complete.  Membrane Rupture Time/Date: 3:08 PM ,07/07/2019   Delivery Method:Vaginal, Spontaneous  Episiotomy: None  Lacerations:  Labial  Patient had an uncomplicated postpartum course.  She is ambulating, tolerating a regular diet, passing flatus, and urinating well. Patient is discharged home in stable condition on 07/09/19.  Newborn Data: Birth date:07/07/2019  Birth time:7:45 PM  Gender:Female  Living status:Living  Apgars:9 ,9  Weight:3164 g   Magnesium Sulfate received: No BMZ received: No Rhophylac:No MMR:No T-DaP: Declined Flu: No Transfusion:No  Physical exam  Vitals:   07/08/19 0810 07/08/19 1205 07/08/19 2220 07/09/19 0535  BP: 111/70 116/67 113/73 114/79  Pulse: 67 69 66 83  Resp: '16 18 15 16  '$ Temp: 98.1 F (36.7 C) 98.3 F (36.8 C) 98.4 F (36.9 C) 98.4 F (36.9 C)  TempSrc: Oral Oral Oral  Oral  SpO2: 100%     Weight:      Height:       General: alert, cooperative and no distress Lochia: appropriate Uterine Fundus: firm Incision: N/A DVT Evaluation: No evidence of DVT seen on physical exam. Labs: Lab Results  Component Value Date   WBC 15.2 (H) 07/07/2019   HGB 12.8 07/07/2019   HCT 39.0 07/07/2019   MCV 91.5 07/07/2019   PLT 242 07/07/2019   CMP Latest Ref Rng & Units 06/17/2019  Glucose 65 - 99 mg/dL 66  BUN 6 - 20 mg/dL 7  Creatinine 0.57 - 1.00 mg/dL 0.60  Sodium 134 - 144 mmol/L 137  Potassium 3.5 - 5.2 mmol/L 4.4  Chloride 96 - 106 mmol/L 103  CO2 20 - 29 mmol/L 21  Calcium 8.7 - 10.2 mg/dL 9.7  Total Protein 6.0 - 8.5 g/dL 6.2  Total Bilirubin 0.0 - 1.2 mg/dL 0.6  Alkaline Phos 39 - 117 IU/L 202(H)  AST 0 - 40 IU/L 17  ALT 0 - 32 IU/L 7   Edinburgh Score: Edinburgh Postnatal Depression Scale Screening Tool 07/08/2019  I have been able to laugh and see the funny side of things. 0  I have looked forward with enjoyment to things. 0  I have blamed myself unnecessarily when things went wrong. 1  I have been anxious or worried for no good reason. 0  I have felt scared or  panicky for no good reason. 0  Things have been getting on top of me. 0  I have been so unhappy that I have had difficulty sleeping. 0  I have felt sad or miserable. 0  I have been so unhappy that I have been crying. 0  The thought of harming myself has occurred to me. 0  Edinburgh Postnatal Depression Scale Total 1     After visit meds:  Allergies as of 07/09/2019   No Known Allergies     Medication List    STOP taking these medications   hydrOXYzine 25 MG capsule Commonly known as: Vistaril     TAKE these medications   acetaminophen 325 MG tablet Commonly known as: TYLENOL Take 325-650 mg by mouth every 6 (six) hours as needed for mild pain or headache.   Blood Pressure Kit Devi 1 Device by Does not apply route as needed.   Independence Supp Sm Misc Wear  as directed.   ferrous sulfate 325 (65 FE) MG tablet Take 1 tablet (325 mg total) by mouth daily with breakfast.   ibuprofen 600 MG tablet Commonly known as: ADVIL Take 1 tablet (600 mg total) by mouth 4 (four) times daily.   Prenatal Gummies/DHA & FA 0.4-32.5 MG Chew Chew by mouth.        Discharge home in stable condition Infant Feeding: No evidence of DVT seen on physical exam. Infant Disposition:home with mother Discharge instruction: per After Visit Summary and Postpartum booklet. Activity: Advance as tolerated. Pelvic rest for 6 weeks.  Diet: routine diet Future Appointments: Future Appointments  Date Time Provider Department Center  08/04/2019  2:20 PM Leftwich-Kirby, Kathie Dike, CNM [facility] None   Follow up Visit: Follow-up Information    [place]. Go in 4 week(s).   Why: for postpartum visit Contact information: [address] [place] [address] [phone]           Please schedule this patient for a Virtual postpartum visit in 4 weeks with the following provider: Any provider. Additional Postpartum F/U:None  Low risk pregnancy complicated by: none Delivery mode:  Vaginal, Spontaneous  Anticipated Birth Control:  patch   07/09/2019 Bells, DO   OB FELLOW ATTESTATION  I have seen and examined this patient and edited the above documentation in the resident's note to reflect any changes or updates.  Augustin Coupe, MD/[facility] OB Fellow  07/09/2019, 7:17 AM

## 2019-07-07 NOTE — Anesthesia Preprocedure Evaluation (Signed)

## 2019-07-07 NOTE — MAU Provider Note (Signed)
S: Ms. Danielle Levine is a 19 y.o. G1P0 at [redacted]w[redacted]d  who presents to MAU today complaining contractions q 10 minutes since 0100. She denies vaginal bleeding. She denies LOF. She reports normal fetal movement.    O: BP 119/66   Pulse 74   Temp 98.3 F (36.8 C) (Oral)   Resp 16   Wt 64.6 kg   LMP 10/07/2018   SpO2 99%   BMI 25.24 kg/m  GENERAL: Well-developed, well-nourished female in no acute distress.  HEAD: Normocephalic, atraumatic.  CHEST: Normal effort of breathing, regular heart rate ABDOMEN: Soft, nontender, gravid  Cervical exam:  Dilation: 3.5 Effacement (%): 80 Cervical Position: Middle Station: -2 Presentation: Vertex Exam by:: Irish Elders, RN   Fetal Monitoring: Baseline: 120 Variability: moderate Accelerations: 15x15 Decelerations: none Contractions: 7-10  Patient observed and rechecked after 1 hour with no cervical change.  A: SIUP at [redacted]w[redacted]d  False labor  P: -Discharge home in stable condition -Labor precautions discussed -Patient advised to follow-up with [facility] as scheduled for prenatal care -Patient may return to MAU as needed or if her condition were to change or worsen   Rolm Bookbinder, [place] 07/07/2019 5:47 AM

## 2019-07-07 NOTE — MAU Note (Signed)
Danielle Levine is a 19 y.o. at 102w0d here in MAU reporting: contractions every 2.5-3 minutes for the past 3-4 hours. States they are worse then when she was here earlier. Having some bloody show. No LOF. +FM  Onset of complaint: ongoing but progressively worse  Pain score: 8/10  Vitals:   07/07/19 1202  BP: 120/76  Pulse: 78  Resp: 18  Temp: 98.2 F (36.8 C)  SpO2: 100%     FHT: +FM  Lab orders placed from triage: none

## 2019-07-07 NOTE — Progress Notes (Signed)
Labor Progress Note Danielle Levine is a 19 y.o. G1P0 at [redacted]w[redacted]d presented for SOL. S: Comfortable with epidural.   O:  BP 108/60   Pulse 83   Temp 98.7 F (37.1 C) (Oral)   Resp 16   Ht 5\' 3"  (1.6 m)   Wt 64.6 kg   LMP 10/07/2018   SpO2 100% Comment: room air  BMI 25.24 kg/m  EFM: 135, moderate variability, pos accels, no decels, reactive TOCO: q2-48m  CVE: Dilation: Lip/rim Effacement (%): 100 Cervical Position: Middle Station: Plus 1 Presentation: Vertex Exam by:: [place], rn   A&P: 19 y.o. G1P0 [redacted]w[redacted]d here for SOL. #Labor: Progressing well. S/p SROM. Continue expectant management. Anticipate SVD. #Pain: Epidural  #FWB: Cat I #GBS positive; S/p adequate Amp ppx - will give second dose of Amp 1 g at 1900 if not delivered  [redacted]w[redacted]d, MD 5:14 PM

## 2019-07-07 NOTE — H&P (Signed)
OBSTETRIC ADMISSION HISTORY AND PHYSICAL  Danielle Levine is a 19 y.o. female G1P0 with IUP at 57w0dby LMP presenting for SOL. She reports +FMs, No LOF, no VB, no blurry vision, headaches or peripheral edema, and RUQ pain.  She plans on bottle feeding. She requests the patch for birth control. [facility]   Dating: By LMP --->  Estimated Date of Delivery: 07/14/19  Sono:  06/26/2019  _0 , CWD, normal anatomy, cephalic presentation, posterior placental lie, 2805g, 21% EFW  Prenatal History/Complications: Alpha Thalassemia Silent Carrier GBS pos  Past Medical History: No past medical history on file.  Past Surgical History: Past Surgical History:  Procedure Laterality Date  . TONSILLECTOMY      Obstetrical History: OB History    Gravida  1   Para      Term      Preterm      AB      Living  0     SAB      TAB      Ectopic      Multiple      Live Births              Social History: Social History   Socioeconomic History  . Marital status: Single    Spouse name: Not on file  . Number of children: Not on file  . Years of education: Not on file  . Highest education level: Not on file  Occupational History  . Not on file  Tobacco Use  . Smoking status: Never Smoker  . Smokeless tobacco: Never Used  Substance and Sexual Activity  . Alcohol use: Never  . Drug use: Never  . Sexual activity: Yes    Birth control/protection: None  Other Topics Concern  . Not on file  Social History Narrative  . Not on file   Social Determinants of Health   Financial Resource Strain:   . Difficulty of Paying Living Expenses:   Food Insecurity:   . Worried About RCharity fundraiserin the Last Year:   . RArboriculturistin the Last Year:   Transportation Needs:   . LFilm/video editor(Medical):   .Marland KitchenLack of Transportation (Non-Medical):   Physical Activity:   . Days of Exercise per Week:   . Minutes of Exercise per Session:    Stress:   . Feeling of Stress :   Social Connections:   . Frequency of Communication with Friends and Family:   . Frequency of Social Gatherings with Friends and Family:   . Attends Religious Services:   . Active Member of Clubs or Organizations:   . Attends CArchivistMeetings:   .Marland KitchenMarital Status:     Family History: Family History  Problem Relation Age of Onset  . Healthy Mother   . Healthy Father     Allergies: No Known Allergies  Medications Prior to Admission  Medication Sig Dispense Refill Last Dose  . Blood Pressure Monitoring (BLOOD PRESSURE KIT) DEVI 1 Device by Does not apply route as needed. 1 each 0   . Elastic Bandages & Supports (COMFORT FIT MATERNITY SUPP SM) MISC Wear as directed. 1 each 0   . ferrous sulfate 325 (65 FE) MG tablet Take 1 tablet (325 mg total) by mouth daily with breakfast. 30 tablet 3   . hydrOXYzine (VISTARIL) 25 MG capsule Take 1 capsule (25 mg total) by mouth every 6 (six) hours as needed. 3[place]  capsule 0   . Prenatal MV-Min-FA-Omega-3 (PRENATAL GUMMIES/DHA & FA) 0.4-32.5 MG CHEW Chew by mouth.        Review of Systems   All systems reviewed and negative except as stated in HPI  Blood pressure 120/76, pulse 78, temperature 98.2 F (36.8 C), temperature source Oral, resp. rate 18, last menstrual period 10/07/2018, SpO2 100 %. General appearance: alert, cooperative and appears stated age Lungs: normal effort Heart: regular rate  Abdomen: soft, non-tender; bowel sounds normal Pelvic: gravid uterus Extremities: Homans sign is negative, no sign of DVT Presentation: cephalic Fetal monitoringBaseline: 125 bpm, Variability: Good {> 6 bpm), Accelerations: Reactive and Decelerations: Absent Uterine activity: Frequency: Every 3 minutes Dilation: 6 Effacement (%): 90 Exam by:: B. Bowen   Prenatal labs: ABO, Rh: AB/Positive/-- (12/11 1132) Antibody: Negative (12/11 1132) Rubella: 2.01 (12/11 1132) RPR: Non Reactive (03/02 0846)   HBsAg: Negative (12/11 1132)  HIV: Non Reactive (03/02 0846)  GBS: Positive/-- (04/27 0343)  2 hr Glucola WNL Genetic screening  Low risk Anatomy US WNL  Prenatal Transfer Tool  Maternal Diabetes: No Genetic Screening: Normal Maternal Ultrasounds/Referrals: Normal Fetal Ultrasounds or other Referrals:  None Maternal Substance Abuse:  No Significant Maternal Medications:  None Significant Maternal Lab Results: Group B Strep positive  No results found for this or any previous visit (from the past 24 hour(s)).  Patient Active Problem List   Diagnosis Date Noted  . Labor and delivery, indication for care 07/07/2019  . Uterine size date discrepancy 07/01/2019  . GBS (group B Streptococcus carrier), +RV culture, currently pregnant 06/19/2019  . Alpha thalassemia silent carrier 02/17/2019  . Supervision of normal pregnancy, antepartum 01/30/2019    Assessment/Plan:  Danielle Levine is a 19 y.o. G1P0 at 50w0dhere for SOL.   #Labor: Vertex by BSUS. Arrived at 6 cm with bulging bag. Expectant management unless maternal/fetal indication for GBS ppx then possible AROM. Anticipate SVD.  #Pain: Per patient request #FWB: Cat I; EFW: 3100g #ID:  GBS pos; Amp #MOF: Bottle #MOC: Patch #Circ:  Inpatient   CBarrington Ellison MD [facility] Fellow, [facility] Center[facility] 07/07/2019, 12:33 PM

## 2019-07-07 NOTE — Anesthesia Procedure Notes (Signed)
Epidural Patient location during procedure: OB Start time: 07/07/2019 1:43 PM End time: 07/07/2019 1:53 PM  Staffing Anesthesiologist: Lucretia Kern, MD Performed: anesthesiologist   Preanesthetic Checklist Completed: patient identified, IV checked, risks and benefits discussed, monitors and equipment checked, pre-op evaluation and timeout performed  Epidural Patient position: sitting Prep: DuraPrep Patient monitoring: heart rate, continuous pulse ox and blood pressure Approach: midline Location: L3-L4 Injection technique: LOR air  Needle:  Needle type: Tuohy  Needle gauge: 17 G Needle length: 9 cm Needle insertion depth: 5 cm Catheter type: closed end flexible Catheter size: 19 Gauge Catheter at skin depth: 10 cm Test dose: negative  Assessment Events: blood not aspirated, injection not painful, no injection resistance, no paresthesia and negative IV test  Additional Notes Reason for block:procedure for pain

## 2019-07-08 ENCOUNTER — Encounter: Payer: BC Managed Care – PPO | Admitting: Nurse Practitioner

## 2019-07-08 LAB — RPR: RPR Ser Ql: NONREACTIVE

## 2019-07-08 MED ORDER — BENZOCAINE-MENTHOL 20-0.5 % EX AERO
1.0000 "application " | INHALATION_SPRAY | CUTANEOUS | Status: DC | PRN
  Administered 2019-07-08: 1 via TOPICAL
  Filled 2019-07-08: qty 56

## 2019-07-08 MED ORDER — TETANUS-DIPHTH-ACELL PERTUSSIS 5-2.5-18.5 LF-MCG/0.5 IM SUSP: 0.5000 mL | Freq: Once | INTRAMUSCULAR | Status: DC

## 2019-07-08 MED ORDER — IBUPROFEN 600 MG PO TABS
600.0000 mg | ORAL_TABLET | Freq: Three times a day (TID) | ORAL | Status: DC | PRN
  Administered 2019-07-08: 600 mg via ORAL
  Filled 2019-07-08: qty 1

## 2019-07-08 MED ORDER — DIPHENHYDRAMINE HCL 25 MG PO CAPS: 25.0000 mg | ORAL_CAPSULE | Freq: Four times a day (QID) | ORAL | Status: DC | PRN

## 2019-07-08 MED ORDER — ONDANSETRON HCL 4 MG PO TABS: 4.0000 mg | ORAL_TABLET | ORAL | Status: DC | PRN

## 2019-07-08 MED ORDER — ACETAMINOPHEN 325 MG PO TABS
650.0000 mg | ORAL_TABLET | ORAL | Status: DC | PRN
  Administered 2019-07-08 (×2): 650 mg via ORAL
  Filled 2019-07-08 (×2): qty 2

## 2019-07-08 MED ORDER — ONDANSETRON HCL 4 MG/2ML IJ SOLN: 4.0000 mg | INTRAMUSCULAR | Status: DC | PRN

## 2019-07-08 MED ORDER — IBUPROFEN 600 MG PO TABS
600.0000 mg | ORAL_TABLET | Freq: Four times a day (QID) | ORAL | Status: DC
  Administered 2019-07-08 – 2019-07-09 (×5): 600 mg via ORAL
  Filled 2019-07-08 (×6): qty 1

## 2019-07-08 MED ORDER — COCONUT OIL OIL: 1.0000 "application " | TOPICAL_OIL | Status: DC | PRN

## 2019-07-08 MED ORDER — SIMETHICONE 80 MG PO CHEW: 80.0000 mg | CHEWABLE_TABLET | ORAL | Status: DC | PRN

## 2019-07-08 MED ORDER — SENNOSIDES-DOCUSATE SODIUM 8.6-50 MG PO TABS
2.0000 | ORAL_TABLET | ORAL | Status: DC
  Administered 2019-07-08 (×2): 2 via ORAL
  Filled 2019-07-08 (×2): qty 2

## 2019-07-08 MED ORDER — DIBUCAINE (PERIANAL) 1 % EX OINT: 1.0000 "application " | TOPICAL_OINTMENT | CUTANEOUS | Status: DC | PRN

## 2019-07-08 MED ORDER — MEASLES, MUMPS & RUBELLA VAC IJ SOLR: 0.5000 mL | Freq: Once | INTRAMUSCULAR | Status: DC

## 2019-07-08 MED ORDER — WITCH HAZEL-GLYCERIN EX PADS: 1.0000 "application " | MEDICATED_PAD | CUTANEOUS | Status: DC | PRN

## 2019-07-08 MED ORDER — ACETAMINOPHEN 325 MG PO TABS
650.0000 mg | ORAL_TABLET | Freq: Four times a day (QID) | ORAL | Status: DC | PRN
  Administered 2019-07-08: 650 mg via ORAL
  Filled 2019-07-08 (×2): qty 2

## 2019-07-08 MED ORDER — PRENATAL MULTIVITAMIN CH
1.0000 | ORAL_TABLET | Freq: Every day | ORAL | Status: DC
  Administered 2019-07-08 – 2019-07-09 (×2): 1 via ORAL
  Filled 2019-07-08 (×2): qty 1

## 2019-07-08 NOTE — Plan of Care (Signed)
  Problem: Education: Goal: Knowledge of General Education information will improve Description: Including pain rating scale, medication(s)/side effects and non-pharmacologic comfort measures Outcome: Completed/Met   Problem: Clinical Measurements: Goal: Ability to maintain clinical measurements within normal limits will improve Outcome: Completed/Met Goal: Will remain free from infection Outcome: Completed/Met Goal: Diagnostic test results will improve Outcome: Completed/Met Goal: Respiratory complications will improve Outcome: Completed/Met Goal: Cardiovascular complication will be avoided Outcome: Completed/Met   Problem: Activity: Goal: Risk for activity intolerance will decrease Outcome: Completed/Met   Problem: Nutrition: Goal: Adequate nutrition will be maintained Outcome: Completed/Met   Problem: Elimination: Goal: Will not experience complications related to urinary retention Outcome: Completed/Met   Problem: Pain Managment: Goal: General experience of comfort will improve Outcome: Completed/Met   Problem: Safety: Goal: Ability to remain free from injury will improve Outcome: Completed/Met

## 2019-07-08 NOTE — Anesthesia Postprocedure Evaluation (Signed)
Anesthesia Post Note  Patient: Danielle Levine  Procedure(s) Performed: AN AD HOC LABOR EPIDURAL     Patient location during evaluation: Mother Baby Anesthesia Type: Epidural Level of consciousness: awake and alert and oriented Pain management: satisfactory to patient Vital Signs Assessment: post-procedure vital signs reviewed and stable Respiratory status: respiratory function stable Cardiovascular status: stable Postop Assessment: no headache, no backache, epidural receding, patient able to bend at knees, no signs of nausea or vomiting, adequate PO intake and no apparent nausea or vomiting Anesthetic complications: no    Last Vitals:  Vitals:   07/08/19 0359 07/08/19 0810  BP: 100/62 111/70  Pulse: 70 67  Resp: 18 16  Temp: 36.9 C 36.7 C  SpO2:  100%    Last Pain:  Vitals:   07/08/19 0810  TempSrc: Oral  PainSc: 3    Pain Goal:                   Avneet Ashmore

## 2019-07-08 NOTE — Progress Notes (Cosign Needed)
Post Partum Day #1 s/p SVD after admission for SOL  Subjective:  Danielle Levine is a 19 y.o. G1P1001 [redacted]w[redacted]d PPD#1 s/p SVD.  No acute events overnight.  Pt denies problems with ambulating, voiding or po intake.  She denies nausea or vomiting.  Pain is moderately controlled.  She has had flatus. She has not had bowel movement.  Lochia Minimal.  Plan for birth control is patch.  Method of Feeding: Bottle   Objective: BP 100/62   Pulse 70   Temp 98.4 F (36.9 C) (Oral)   Resp 18   Ht 5\' 3"  (1.6 m)   Wt 64.6 kg   LMP 10/07/2018   SpO2 100% Comment: room air  Breastfeeding Unknown   BMI 25.24 kg/m   Physical Exam:  General: alert, cooperative and no distress Lochia:normal flow Chest: CTAB Heart: RRR no m/r/g Abdomen: +BS, soft, nontender, fundus firm at/below umbilicus Uterine Fundus: firm DVT Evaluation: No evidence of DVT seen on physical exam. Extremities: No edema  Recent Labs    07/07/19 1227  HGB 12.8  HCT 39.0    Assessment/Plan:  ASSESSMENT: Danielle Levine is a 19 y.o. G1P1001 [redacted]w[redacted]d ppd #1 s/p NSVD doing well.    -Plan for discharge home tomorrow -Circumcision prior to discharge -Contraception: patch -increase tylenol to q4h prn and ibuprofen to q6h scheduled   LOS: 1 day   [redacted]w[redacted]d 07/08/2019, 5:22 AM

## 2019-07-08 NOTE — Progress Notes (Signed)
POSTPARTUM PROGRESS NOTE  Post Partum Day 1  Subjective:  Danielle Levine is a 19 y.o. G1P1001 s/p NSVD at [redacted]w[redacted]d.  She reports she is doing well. No acute events overnight. She denies any problems with ambulating, voiding or po intake. Denies nausea or vomiting.  Pain is well controlled.  Lochia is like a period.  Objective: Blood pressure 100/62, pulse 70, temperature 98.4 F (36.9 C), temperature source Oral, resp. rate 18, height 5\' 3"  (1.6 m), weight 64.6 kg, last menstrual period 10/07/2018, SpO2 100 %, unknown if currently breastfeeding.  Physical Exam:  General: alert, cooperative and no distress Chest: no respiratory distress Heart:regular rate, distal pulses intact Abdomen: soft, nontender,  Uterine Fundus: firm, appropriately tender DVT Evaluation: No calf swelling or tenderness Extremities: no LE edema Skin: warm, dry  Recent Labs    07/07/19 1227  HGB 12.8  HCT 39.0    Assessment/Plan: Danielle Levine is a 19 y.o. G1P1001 s/p NSVD at [redacted]w[redacted]d   PPD#1 - Doing well  Routine postpartum care Contraception: Patch Feeding: Bottle Dispo: Plan for discharge PPD#2.   LOS: 1 day   [redacted]w[redacted]d, MD/MP[facility] OB Fellow  07/08/2019, 7:45 AM

## 2019-07-09 MED ORDER — IBUPROFEN 600 MG PO TABS: 600.0000 mg | ORAL_TABLET | Freq: Four times a day (QID) | ORAL | 0 refills | Status: AC

## 2019-07-09 NOTE — Lactation Note (Signed)
This note was copied from a baby's chart. Lactation Consultation Note  Patient Name: Danielle Levine VFIEP'P Date: 07/09/2019 Reason for consult: Initial assessment   Mother is a P67, infant is 74 hours old.  Mothers plan on admission was to formula feed. Mother has been exclusively formula feeding. Mother reports to staff nurse she wants to try to breastfeed. LC was paged to the bedside to assist with latching infant.   Mother was given [facility] brochure and basic teaching done.   Reviewed hand expression with mother. Observed large drops of colostrum. Mother was given a harmony hand pump with instructions. Mothers nipples are erect with stimulation and compressible breast tissue.    She is active with WIC .  Mother was observed with infant latched on at the left breast. In cross cradle hold.  Observed infant suckling with audible swallows. Infant sustained latch for 20 mins. Infant observed to be jittery. Infant was given 7 ml of formula with a curved tip syringe. Reports the jittery behavior to staff nurse Dorene Grebe.   Staff nurse in to take infant to the nursery for a circumcision. Circumcision was not done due to infant being too jittery. Mother advised to pump her breast with hand pump for 15 mins on each breast after each feeding to offer as much volume to infant, then offer formula as needed.   Discussed the use of a DEBP with staff nurse Sarah.   Mother to continue to cue base feed infant and feed at least 8-12 times or more in 24 hours and advised to allow for cluster feeding infant as needed.   Mother to continue to due STS. Mother is aware of available LC services at [facility], BFSG'S, OP Dept, and phone # for questions or concerns about breastfeeding.  Mother receptive to all teaching and plan of care.     Maternal Data    Feeding Feeding Type: Formula  LATCH Score Latch: Grasps breast easily, tongue down, lips flanged, rhythmical sucking.  Audible Swallowing: Spontaneous and  intermittent  Type of Nipple: Everted at rest and after stimulation  Comfort (Breast/Nipple): Soft / non-tender  Hold (Positioning): Assistance needed to correctly position infant at breast and maintain latch.  LATCH Score: 9  Interventions Interventions: Breast feeding basics reviewed;Assisted with latch;Skin to skin;Hand express;Breast compression;Adjust position;Support pillows;Position options;Hand pump  Lactation Tools Discussed/Used     Consult Status Consult Status: Follow-up Date: 07/10/19 Follow-up type: In-patient    Stevan Born [facility] 07/09/2019, 11:11 AM

## 2019-07-09 NOTE — Discharge Instructions (Signed)

## 2019-07-10 ENCOUNTER — Ambulatory Visit: Payer: Self-pay

## 2019-07-10 NOTE — Lactation Note (Signed)
This note was copied from a baby's chart. Lactation Consultation Note  Patient Name: Danielle Levine Date: 07/10/2019 Reason for consult: Follow-up assessment Baby is 62 hours old/4% weight loss.  Mom states baby has been cluster feeding.  Baby is also receiving formula supplementation.  Mom requests a feeding assist.  Assisted with positioning baby in football hold..Breasts are firm.  FOB shown how to help compress tissue for an easier latch.  Baby latched easily and well.  Observed active suck/swallows.  Discussed milk coming to volume and the prevention and treatment of engorgement.  Mom has a manual pump.  Mom plans on working and returning to school soon.  Discussed pumping when away from baby.  Encouraged mom to contact [facility] for a DEBP.  Questions answered.  Reviewed outpatient services and encouraged to call prn.  Maternal Data    Feeding Feeding Type: Breast Fed Nipple Type: Slow - flow  LATCH Score Latch: Grasps breast easily, tongue down, lips flanged, rhythmical sucking.  Audible Swallowing: A few with stimulation  Type of Nipple: Everted at rest and after stimulation  Comfort (Breast/Nipple): Soft / non-tender  Hold (Positioning): Assistance needed to correctly position infant at breast and maintain latch.  LATCH Score: 8  Interventions Interventions: Breast compression;Assisted with latch;Adjust position;Hand pump;Skin to skin;Support pillows;Breast massage  Lactation Tools Discussed/Used     Consult Status Consult Status: Complete Follow-up type: Call as needed    Huston Foley 07/10/2019, 9:54 AM

## 2019-08-04 ENCOUNTER — Telehealth (HOSPITAL_BASED_OUTPATIENT_CLINIC_OR_DEPARTMENT_OTHER): Payer: Medicaid Other | Admitting: Advanced Practice Midwife

## 2019-08-04 VITALS — BP 125/77 | HR 101

## 2019-08-04 DIAGNOSIS — Z3009 Encounter for other general counseling and advice on contraception: Secondary | ICD-10-CM

## 2019-08-04 MED ORDER — XULANE 150-35 MCG/24HR TD PTWK: 1.0000 | MEDICATED_PATCH | TRANSDERMAL | 11 refills | Status: AC

## 2019-08-04 NOTE — Progress Notes (Signed)
    Post Partum Visit Note  Danielle Levine is a 19 y.o. G25P1001 female who presents for a postpartum visit. She is 4 week postpartum following a normal spontaneous vaginal delivery.  I have fully reviewed the prenatal and intrapartum course. The delivery was at 39 gestational weeks.  Anesthesia: epidural. Postpartum course has been going well. Baby is doing well. Baby is feeding by bottle - gerber prebiotic. Bleeding no bleeding. Bowel function is normal. Bladder function is normal. Patient is not sexually active. Contraception method is abstinence. Pt is interested in Patch for [facility]. Postpartum depression screening: negative, score 1.  The following portions of the patient's history were reviewed and updated as appropriate: allergies, current medications, past family history, past medical history, past social history, past surgical history and problem list.  Review of Systems Pertinent items noted in HPI and remainder of comprehensive ROS otherwise negative.    Objective:  Last menstrual period 10/07/2018, unknown if currently breastfeeding.  Constitutional: well developed, well nourished, no distress HEENT: normocephalic CV: normal rate Pulm/chest wall: normal effort Abdomen: not evaluated due to virtual visit Neuro: alert and oriented x 3 Skin: not evaluated Psych: affect normal   Assessment:   1. Encounter for counseling regarding contraception Discussed LARCs as most effective forms of birth control.  Discussed benefits/risks of other methods.  Pt desires contraceptive patch as she has used this before. Reviewed risk factors and s/sx of DVT/PE/reasons to seek medical care. Pt is not breastfeeding. Wait until 6 weeks PP to start combined contraceptives. Rx for [facility] sent to pharmacy.  - norelgestromin-ethinyl estradiol [facility]) 150-35 MCG/24HR transdermal patch; Place 1 patch onto the skin once a week.  Dispense: 3 patch; Refill: 11  2. Postpartum care following vaginal  delivery   Plan:   Essential components of care per ACOG recommendations:  1.  Mood and well being: Patient with negative depression screening today. Reviewed local resources for support.  - Patient does not use tobacco. - hx of drug use? No    2. Infant care and feeding:  -Patient currently breastmilk feeding? No  -Social determinants of health (SDOH) reviewed in EPIC. No concerns  3. Sexuality, contraception and birth spacing - Patient does not want a pregnancy in the next year.   - Reviewed forms of contraception in tiered fashion. Patient desired Ortho-Evra/Xulane today.   - Discussed birth spacing of 18 months  4. Sleep and fatigue -Encouraged family/partner/community support of 4 hrs of uninterrupted sleep to help with mood and fatigue  5. Physical Recovery  - Discussed patients delivery - Patient had a second degree laceration, perineal healing reviewed. Patient expressed understanding - Patient has urinary incontinence? No - Patient is safe to resume physical and sexual activity  6.  Health Maintenance - Last pap smear done n/a due to young age     Ozzie Hoyle, [facility] for [employer], [facility]

## 2019-09-29 ENCOUNTER — Ambulatory Visit: Payer: Medicaid Other | Admitting: Advanced Practice Midwife

## 2019-09-29 ENCOUNTER — Telehealth: Payer: Self-pay | Admitting: Advanced Practice Midwife

## 2019-09-29 NOTE — Telephone Encounter (Signed)
Pt missed visit scheduled in office today with provider for change in birth control.  Notes indicate pt wants to switch from patch to Depo.  I called and left message for patient today.   If pt desires switch to Depo, she can make RN visit for Depo injections and I will place new order. If she desires full visit with provider, I am happy to see her and review all the options again.  Pt to return call with her decision.

## 2021-11-15 ENCOUNTER — Ambulatory Visit: Payer: BC Managed Care – PPO | Admitting: Advanced Practice Midwife

## 2023-03-29 DIAGNOSIS — Z3A19 19 weeks gestation of pregnancy: Secondary | ICD-10-CM | POA: Diagnosis not present

## 2023-03-29 DIAGNOSIS — O0932 Supervision of pregnancy with insufficient antenatal care, second trimester: Secondary | ICD-10-CM | POA: Diagnosis not present

## 2023-03-29 DIAGNOSIS — G930 Cerebral cysts: Secondary | ICD-10-CM | POA: Diagnosis not present

## 2023-03-29 DIAGNOSIS — O99012 Anemia complicating pregnancy, second trimester: Secondary | ICD-10-CM | POA: Diagnosis not present

## 2023-03-29 DIAGNOSIS — Z363 Encounter for antenatal screening for malformations: Secondary | ICD-10-CM | POA: Diagnosis not present

## 2023-03-29 DIAGNOSIS — D563 Thalassemia minor: Secondary | ICD-10-CM | POA: Diagnosis not present

## 2023-03-29 DIAGNOSIS — Z3687 Encounter for antenatal screening for uncertain dates: Secondary | ICD-10-CM | POA: Diagnosis not present

## 2023-03-29 DIAGNOSIS — O3503X0 Maternal care for (suspected) central nervous system malformation or damage in fetus, choroid plexus cysts, not applicable or unspecified: Secondary | ICD-10-CM | POA: Diagnosis not present

## 2023-04-23 DIAGNOSIS — Z3A23 23 weeks gestation of pregnancy: Secondary | ICD-10-CM | POA: Diagnosis not present

## 2023-04-23 DIAGNOSIS — O3503X0 Maternal care for (suspected) central nervous system malformation or damage in fetus, choroid plexus cysts, not applicable or unspecified: Secondary | ICD-10-CM | POA: Diagnosis not present

## 2023-05-07 DIAGNOSIS — D563 Thalassemia minor: Secondary | ICD-10-CM | POA: Diagnosis not present

## 2023-05-07 DIAGNOSIS — Z362 Encounter for other antenatal screening follow-up: Secondary | ICD-10-CM | POA: Diagnosis not present

## 2023-05-07 DIAGNOSIS — O3503X0 Maternal care for (suspected) central nervous system malformation or damage in fetus, choroid plexus cysts, not applicable or unspecified: Secondary | ICD-10-CM | POA: Diagnosis not present

## 2023-05-07 DIAGNOSIS — Z3A25 25 weeks gestation of pregnancy: Secondary | ICD-10-CM | POA: Diagnosis not present

## 2023-07-10 DIAGNOSIS — O26842 Uterine size-date discrepancy, second trimester: Secondary | ICD-10-CM | POA: Diagnosis not present

## 2023-07-10 DIAGNOSIS — O99013 Anemia complicating pregnancy, third trimester: Secondary | ICD-10-CM | POA: Diagnosis not present

## 2023-07-10 DIAGNOSIS — O26843 Uterine size-date discrepancy, third trimester: Secondary | ICD-10-CM | POA: Diagnosis not present

## 2023-07-10 DIAGNOSIS — O99019 Anemia complicating pregnancy, unspecified trimester: Secondary | ICD-10-CM | POA: Diagnosis not present

## 2023-07-10 DIAGNOSIS — Z3A34 34 weeks gestation of pregnancy: Secondary | ICD-10-CM | POA: Diagnosis not present

## 2023-07-10 DIAGNOSIS — D563 Thalassemia minor: Secondary | ICD-10-CM | POA: Diagnosis not present

## 2023-07-31 DIAGNOSIS — Z3A37 37 weeks gestation of pregnancy: Secondary | ICD-10-CM | POA: Diagnosis not present

## 2023-07-31 DIAGNOSIS — O26843 Uterine size-date discrepancy, third trimester: Secondary | ICD-10-CM | POA: Diagnosis not present

## 2023-07-31 DIAGNOSIS — D563 Thalassemia minor: Secondary | ICD-10-CM | POA: Diagnosis not present

## 2023-07-31 DIAGNOSIS — O99019 Anemia complicating pregnancy, unspecified trimester: Secondary | ICD-10-CM | POA: Diagnosis not present

## 2023-07-31 DIAGNOSIS — O99013 Anemia complicating pregnancy, third trimester: Secondary | ICD-10-CM | POA: Diagnosis not present

## 2023-08-14 DIAGNOSIS — Z3A39 39 weeks gestation of pregnancy: Secondary | ICD-10-CM | POA: Diagnosis not present
